# Patient Record
Sex: Male | Born: 1960
Health system: Southern US, Community
[De-identification: ages and names within clinical notes are randomized; demographics above are authoritative.]

## PROBLEM LIST (undated history)

## (undated) DIAGNOSIS — I10 Essential (primary) hypertension: Secondary | ICD-10-CM

## (undated) DIAGNOSIS — I639 Cerebral infarction, unspecified: Secondary | ICD-10-CM

## (undated) DIAGNOSIS — E7800 Pure hypercholesterolemia, unspecified: Secondary | ICD-10-CM

## (undated) DIAGNOSIS — I6522 Occlusion and stenosis of left carotid artery: Secondary | ICD-10-CM

## (undated) DIAGNOSIS — G459 Transient cerebral ischemic attack, unspecified: Secondary | ICD-10-CM

## (undated) DIAGNOSIS — J45909 Unspecified asthma, uncomplicated: Secondary | ICD-10-CM

## (undated) DIAGNOSIS — I8290 Acute embolism and thrombosis of unspecified vein: Secondary | ICD-10-CM

## (undated) HISTORY — DX: Transient cerebral ischemic attack, unspecified: G45.9

## (undated) HISTORY — DX: Occlusion and stenosis of left carotid artery: I65.22

## (undated) HISTORY — DX: Cerebral infarction, unspecified: I63.9

## (undated) HISTORY — DX: Essential (primary) hypertension: I10

## (undated) HISTORY — DX: Pure hypercholesterolemia, unspecified: E78.00

## (undated) HISTORY — DX: Acute embolism and thrombosis of unspecified vein: I82.90

## (undated) HISTORY — DX: Unspecified asthma, uncomplicated: J45.909

---

## 2018-04-04 ENCOUNTER — Ambulatory Visit: Payer: Self-pay | Admitting: Adult Health

## 2018-04-04 ENCOUNTER — Encounter: Payer: Self-pay | Admitting: Adult Health

## 2018-04-04 VITALS — BP 150/66 | HR 67 | Temp 99.9°F | Ht 67.0 in | Wt 164.9 lb

## 2018-04-04 DIAGNOSIS — J449 Chronic obstructive pulmonary disease, unspecified: Secondary | ICD-10-CM

## 2018-04-04 DIAGNOSIS — J45909 Unspecified asthma, uncomplicated: Secondary | ICD-10-CM | POA: Insufficient documentation

## 2018-04-04 DIAGNOSIS — F17200 Nicotine dependence, unspecified, uncomplicated: Secondary | ICD-10-CM

## 2018-04-04 DIAGNOSIS — J4530 Mild persistent asthma, uncomplicated: Secondary | ICD-10-CM

## 2018-04-04 DIAGNOSIS — I10 Essential (primary) hypertension: Secondary | ICD-10-CM

## 2018-04-04 DIAGNOSIS — Z0000 Encounter for general adult medical examination without abnormal findings: Secondary | ICD-10-CM

## 2018-04-04 DIAGNOSIS — E782 Mixed hyperlipidemia: Secondary | ICD-10-CM

## 2018-04-04 DIAGNOSIS — Z8673 Personal history of transient ischemic attack (TIA), and cerebral infarction without residual deficits: Secondary | ICD-10-CM

## 2018-04-04 MED ORDER — ALBUTEROL SULFATE HFA 108 (90 BASE) MCG/ACT IN AERS: 1.0000 | INHALATION_SPRAY | RESPIRATORY_TRACT | 2 refills | Status: AC | PRN

## 2018-04-04 MED ORDER — HYDROCHLOROTHIAZIDE 12.5 MG PO CAPS: 12.5000 mg | ORAL_CAPSULE | Freq: Every day | ORAL | 3 refills | Status: AC

## 2018-04-04 MED ORDER — ATORVASTATIN CALCIUM 10 MG PO TABS: 10.0000 mg | ORAL_TABLET | Freq: Every day | ORAL | 3 refills | Status: DC

## 2018-04-04 MED ORDER — ASPIRIN EC 81 MG PO TBEC: 81.0000 mg | DELAYED_RELEASE_TABLET | Freq: Every day | ORAL | 3 refills | Status: DC

## 2018-04-04 MED ORDER — TIOTROPIUM BROMIDE MONOHYDRATE 18 MCG IN CAPS: 18.0000 ug | ORAL_CAPSULE | Freq: Every day | RESPIRATORY_TRACT | 12 refills | Status: AC

## 2018-04-04 MED ORDER — LISINOPRIL 20 MG PO TABS: 20.0000 mg | ORAL_TABLET | Freq: Every day | ORAL | 2 refills | Status: AC

## 2018-04-04 MED FILL — ASPIRIN ADULT LOW STRENGTH: 81 | 30 days supply | Qty: 30 | Fill #0 | Status: TO

## 2018-04-04 MED FILL — ATORVASTATIN 10 MG TABLET: 10 | 30 days supply | Qty: 30 | Fill #0 | Status: TO

## 2018-04-04 MED FILL — LISINOPRIL 20 MG TABLET: 20 | 90 days supply | Qty: 90 | Fill #0 | Status: TO

## 2018-04-04 MED FILL — HYDROCHLOROTHIAZIDE 12.5 MG: 12.5 | 90 days supply | Qty: 90 | Fill #0 | Status: TO

## 2018-04-04 MED FILL — SPIRIVA 18 MCG CP-HANDIHALE: 18 | 30 days supply | Qty: 30 | Fill #0 | Status: TO

## 2018-04-04 MED FILL — ALBUTEROL SULFATE HFA 108 (: 108 (90 BAS | 34 days supply | Qty: 18 | Fill #0 | Status: TO

## 2018-04-04 NOTE — Progress Notes (Signed)
Patient ID: Tyler Mathews, male   DOB: 1961-10-05, 57 y.o.   MRN: [id number] CC: MY BLOOD PRESSURE IS HIGH  HPI Tyler Mathews is a 57 y.o. male who presents for an initial office visit, physical exam and follow-up for hypertension. Reports blood pressure readings in the 190s for the past couple of days. He is not currently taking any BP medications. He was on lisinopril and HCTZ but stopped taking them about 7 months ago due to  Financial reasons. He has a h/o CVA and reports multiple TIAs. He was on a statin and ASA but equally stopped taking them. He denies headache, dizziness, chest pain, palpitations, nausea and vomiting but reports residual speech impairment from previous CVA and difficulty ambulating. He currently living in a homeless shelter. He is a current every day smoker and smokes about half a pack a day. He has asthma and  uses an albuterol inhaler and reports using it about 4-5 times a day. He denies cough, wheezing but reports occasional dyspnea and DOE.    Past Medical History:  Diagnosis Date  . Asthma   . Carotid artery obstruction, left   . Clot    behind right eye  . High cholesterol   . Hypertension   . Stroke ([place])   . Tyler (transient ischemic attack)     History reviewed. No pertinent surgical history.  Family History  Problem Relation Age of Onset  . Heart disease Mother   . Stroke Father   . Stroke Brother     Social History Social History   Tobacco Use  . Smoking status: Current Every Day Smoker    Packs/day: 0.25  . Smokeless tobacco: Never Used  Substance Use Topics  . Alcohol use: Not Currently  . Drug use: Not Currently    No Known Allergies  Current Outpatient Medications  Medication Sig Dispense Refill  . albuterol (PROVENTIL HFA;VENTOLIN HFA) 108 (90 Base) MCG/ACT inhaler Inhale 1 puff into the lungs every 4 (four) hours as needed for wheezing or shortness of breath. 1 Inhaler 2  . hydrochlorothiazide (MICROZIDE) 12.5 MG capsule Take 1  capsule (12.5 mg total) by mouth daily. 90 capsule 3  . lisinopril (PRINIVIL,ZESTRIL) 20 MG tablet Take 1 tablet (20 mg total) by mouth at bedtime. 90 tablet 2  . aspirin EC 81 MG tablet Take 1 tablet (81 mg total) by mouth daily. 30 tablet 3  . atorvastatin (LIPITOR) 10 MG tablet Take 1 tablet (10 mg total) by mouth daily at 6 PM. 30 tablet 3  . tiotropium (SPIRIVA) 18 MCG inhalation capsule Place 1 capsule (18 mcg total) into inhaler and inhale daily. 30 capsule 12   No current facility-administered medications for this visit.     Review of Systems Review of Systems  Constitutional: Negative.   HENT: Negative.   Eyes: Positive for visual disturbance (uses glasses and reports blurred vision).  Respiratory: Positive for shortness of breath. Negative for apnea and wheezing.   Cardiovascular: Negative.   Gastrointestinal: Negative.   Endocrine: Negative.   Genitourinary: Negative.   Musculoskeletal: Negative.   Skin: Negative.   Allergic/Immunologic: Negative.   Neurological: Positive for speech difficulty and weakness (mild leg weakness). Negative for dizziness and headaches.  Hematological: Negative.   Psychiatric/Behavioral: Negative.     Blood pressure (!) 150/66, pulse 67, temperature 99.9 F (37.7 C), height _0  (1.702 m), weight 164 lb 14.4 oz (74.8 kg).  Physical Exam Physical Exam  Constitutional: He is oriented to person, place, and time.  He appears well-developed and well-nourished.  HENT:  Head: Normocephalic and atraumatic.  Nose: Nose normal.  Mouth/Throat: Oropharynx is clear and moist.  Eyes: Pupils are equal, round, and reactive to light. Conjunctivae and EOM are normal.  Neck: Normal range of motion. Neck supple.  Cardiovascular: Normal rate, regular rhythm, normal heart sounds and intact distal pulses.  Pulmonary/Chest: Effort normal and breath sounds normal.  Abdominal: Soft. Bowel sounds are normal.  Genitourinary:  Genitourinary Comments: deferred   Musculoskeletal: Normal range of motion.  Neurological: He is alert and oriented to person, place, and time.  Skin: Skin is warm and dry.  Psychiatric: He has a normal mood and affect.    Data Reviewed Baseline labs ordered  Assessment and plan 1. Health maintenance examination Residual defects from CVA.  - CBC w/Diff - Comp Met (CMET) - Magnesium - Phosphorus - Lipid Profile - HgB A1c - PSA - TSH - HepB+HepC+HIV Panel - Urine Microalbumin w/creat. ratio - Urinalysis, Routine w reflex microscopic  2. Hypertension, uncontrolled Start HCTZ 12.5 MG Q AM and lisinopril 24m QHS.  Monitor and log blood pressure readings  3. Mixed hyperlipidemia Will check lipid panel and start Lipitor 14mQHS  4. History of CVA (cerebrovascular accident) BP and lipid control and ASA daily  5. Mild persistent asthma without complication Continue albuterol inhaler and start  Spiriva 18 mcg inhaled daily  6. Chronic obstructive pulmonary disease, unspecified COPD type (HCS.N.P.J.No baseline PFTs. Continue current inhalers  7. Tobacco use disorder Smoking cessation advice given. Patient is not willing to quit  Tyler Mathews 04/04/2018, 4:29 PM

## 2018-04-06 LAB — URINALYSIS, ROUTINE W REFLEX MICROSCOPIC
BILIRUBIN UA: NEGATIVE
Glucose, UA: NEGATIVE
KETONES UA: NEGATIVE
Leukocytes, UA: NEGATIVE
NITRITE UA: NEGATIVE
PH UA: 5.5 (ref 5.0–7.5)
RBC, UA: NEGATIVE
Specific Gravity, UA: 1.016 (ref 1.005–1.030)
UUROB: 0.2 mg/dL (ref 0.2–1.0)

## 2018-04-06 LAB — COMPREHENSIVE METABOLIC PANEL
ALBUMIN: 4.4 g/dL (ref 3.5–5.5)
ALK PHOS: 73 IU/L (ref 39–117)
ALT: 11 IU/L (ref 0–44)
AST: 16 IU/L (ref 0–40)
Albumin/Globulin Ratio: 1.9 (ref 1.2–2.2)
BUN / CREAT RATIO: 10 (ref 9–20)
BUN: 14 mg/dL (ref 6–24)
Bilirubin Total: 0.3 mg/dL (ref 0.0–1.2)
CO2: 26 mmol/L (ref 20–29)
CREATININE: 1.46 mg/dL — AB (ref 0.76–1.27)
Calcium: 9 mg/dL (ref 8.7–10.2)
Chloride: 101 mmol/L (ref 96–106)
GFR calc Af Amer: 62 mL/min/{1.73_m2} (ref >59)
GFR calc non Af Amer: 53 mL/min/{1.73_m2} — ABNORMAL LOW (ref >59)
GLUCOSE: 105 mg/dL — AB (ref 65–99)
Globulin, Total: 2.3 g/dL (ref 1.5–4.5)
Potassium: 4.6 mmol/L (ref 3.5–5.2)
Sodium: 142 mmol/L (ref 134–144)
Total Protein: 6.7 g/dL (ref 6.0–8.5)

## 2018-04-06 LAB — HEPB+HEPC+HIV PANEL
HEP B C IGM: NEGATIVE
HEP B E AB: NEGATIVE
HEP B E AG: NEGATIVE
HIV Screen 4th Generation wRfx: NONREACTIVE
Hep B Core Total Ab: NEGATIVE
Hep B Surface Ab, Qual: NONREACTIVE
Hepatitis B Surface Ag: NEGATIVE

## 2018-04-06 LAB — LIPID PANEL
CHOLESTEROL TOTAL: 165 mg/dL (ref 100–199)
Chol/HDL Ratio: 4.9 ratio (ref 0.0–5.0)
HDL: 34 mg/dL — AB (ref >39)
LDL Calculated: 110 mg/dL — ABNORMAL HIGH (ref 0–99)
TRIGLYCERIDES: 105 mg/dL (ref 0–149)
VLDL Cholesterol Cal: 21 mg/dL (ref 5–40)

## 2018-04-06 LAB — CBC WITH DIFFERENTIAL/PLATELET
BASOS ABS: 0 10*3/uL (ref 0.0–0.2)
Basos: 1 %
EOS (ABSOLUTE): 0.2 10*3/uL (ref 0.0–0.4)
Eos: 3 %
HEMOGLOBIN: 14.2 g/dL (ref 13.0–17.7)
Hematocrit: 41.8 % (ref 37.5–51.0)
IMMATURE GRANS (ABS): 0 10*3/uL (ref 0.0–0.1)
Immature Granulocytes: 0 %
LYMPHS ABS: 2.1 10*3/uL (ref 0.7–3.1)
LYMPHS: 33 %
MCH: 33.9 pg — ABNORMAL HIGH (ref 26.6–33.0)
MCHC: 34 g/dL (ref 31.5–35.7)
MCV: 100 fL — ABNORMAL HIGH (ref 79–97)
Monocytes Absolute: 0.4 10*3/uL (ref 0.1–0.9)
Monocytes: 7 %
Neutrophils Absolute: 3.6 10*3/uL (ref 1.4–7.0)
Neutrophils: 56 %
PLATELETS: 195 10*3/uL (ref 150–379)
RBC: 4.19 x10E6/uL (ref 4.14–5.80)
RDW: 13.8 % (ref 12.3–15.4)
WBC: 6.4 10*3/uL (ref 3.4–10.8)

## 2018-04-06 LAB — MICROALBUMIN / CREATININE URINE RATIO
Creatinine, Urine: 189.9 mg/dL
MICROALBUM., U, RANDOM: 47.1 ug/mL
Microalb/Creat Ratio: 24.8 mg/g creat (ref 0.0–30.0)

## 2018-04-06 LAB — TSH: TSH: 3.4 u[IU]/mL (ref 0.450–4.500)

## 2018-04-06 LAB — HEMOGLOBIN A1C
Est. average glucose Bld gHb Est-mCnc: 111 mg/dL
Hgb A1c MFr Bld: 5.5 % (ref 4.8–5.6)

## 2018-04-06 LAB — MAGNESIUM: MAGNESIUM: 2.1 mg/dL (ref 1.6–2.3)

## 2018-04-06 LAB — PSA: PROSTATE SPECIFIC AG, SERUM: 1.8 ng/mL (ref 0.0–4.0)

## 2018-04-06 LAB — PHOSPHORUS: PHOSPHORUS: 3 mg/dL (ref 2.5–4.5)

## 2018-04-08 ENCOUNTER — Ambulatory Visit: Payer: Self-pay | Admitting: Pharmacy Technician

## 2018-04-08 DIAGNOSIS — Z79899 Other long term (current) drug therapy: Secondary | ICD-10-CM

## 2018-04-08 NOTE — Progress Notes (Signed)
Patient inquired about applying for financial assistance  for [facility].  Patient did not want to complete application during eligibility appointment.  Provided patient with financial application for charity care.  Patient agreed to complete application and gather financial information and forward to appropriate department in [facility].    Completed [facility] application and contract.  Patient agreed to all terms of the [facility] contract.    Patient approved to receive medication assistance at [facility] through 2019, as long as eligibility criteria continues to be met.    Provided patient with Civil engineer, contracting based on his particular needs.    Referred patient for MTM.  Proventil Prescription Application and Spiriva Handihaler Prescription Application completed with patient.  Forwarded to [facility] for signature.  Upon receipt of signed applications from [facility], Proventil Prescription Application and Spiriva Handihaler Prescription Application will be submitted to manufacturers.  [facility]

## 2018-04-10 ENCOUNTER — Encounter: Payer: Self-pay | Admitting: Internal Medicine

## 2018-04-10 ENCOUNTER — Ambulatory Visit: Payer: Medicaid Other | Admitting: Internal Medicine

## 2018-04-10 VITALS — BP 125/75 | HR 54 | Temp 98.7°F | Wt 163.6 lb

## 2018-04-10 DIAGNOSIS — I10 Essential (primary) hypertension: Secondary | ICD-10-CM

## 2018-04-10 DIAGNOSIS — Z8673 Personal history of transient ischemic attack (TIA), and cerebral infarction without residual deficits: Secondary | ICD-10-CM

## 2018-04-10 DIAGNOSIS — J4530 Mild persistent asthma, uncomplicated: Secondary | ICD-10-CM

## 2018-04-10 NOTE — Progress Notes (Signed)
   Subjective:    Patient ID: Tyler Mathews, male    DOB: 1961-11-09, 57 y.o.   MRN: [id number]  HPI   Pt is here for a f/u on labs. He has no new complaints.  Pt has a history of strokes and a left carotid artery obstruction.  Allergies as of 04/10/2018   No Known Allergies     Medication List        Accurate as of 04/10/18  9:43 AM. Always use your most recent med list.          albuterol 108 (90 Base) MCG/ACT inhaler Commonly known as:  PROVENTIL HFA;VENTOLIN HFA Inhale 1 puff into the lungs every 4 (four) hours as needed for wheezing or shortness of breath.   aspirin EC 81 MG tablet Take 1 tablet (81 mg total) by mouth daily.   atorvastatin 10 MG tablet Commonly known as:  LIPITOR Take 1 tablet (10 mg total) by mouth daily at 6 PM.   hydrochlorothiazide 12.5 MG capsule Commonly known as:  MICROZIDE Take 1 capsule (12.5 mg total) by mouth daily.   lisinopril 20 MG tablet Commonly known as:  PRINIVIL,ZESTRIL Take 1 tablet (20 mg total) by mouth at bedtime.   tiotropium 18 MCG inhalation capsule Commonly known as:  SPIRIVA Place 1 capsule (18 mcg total) into inhaler and inhale daily.      Patient Active Problem List   Diagnosis Date Noted  . Asthma 04/04/2018     Review of Systems     Objective:   Physical Exam  Constitutional: He is oriented to person, place, and time.  Cardiovascular: Normal rate, regular rhythm and normal heart sounds.  Pulmonary/Chest: Effort normal and breath sounds normal.  Neurological: He is alert and oriented to person, place, and time.    BP 125/75   Pulse (!) 54   Temp 98.7 F (37.1 C) (Oral)   Wt 163 lb 9.6 oz (74.2 kg)   BMI 25.62 kg/m        Assessment & Plan:   F/u in 6 months w/ labs. Advised pt to drink more water to flush out toxins.

## 2018-04-11 ENCOUNTER — Ambulatory Visit: Payer: Self-pay | Admitting: Adult Health

## 2018-04-11 ENCOUNTER — Ambulatory Visit: Payer: Self-pay

## 2018-04-29 ENCOUNTER — Telehealth: Payer: Self-pay | Admitting: Pharmacist

## 2018-04-29 NOTE — Telephone Encounter (Signed)
04/29/2018 8:53:42 AM - Ventolin HFA & Incruse Ellipta  04/29/18 Faxed [employer] application for enrollment for Ventolin HFA 7090mcg-Inhale 1 puff into the lungs every 4 hours as needed for wheezing & shortness of breath, also Incruse Ellipta 62.85mcg-Inhale 1 puff into the lungs once a day everyday. Forde RadonAJ

## 2018-04-30 ENCOUNTER — Telehealth: Payer: Self-pay | Admitting: Pharmacist

## 2018-05-21 ENCOUNTER — Ambulatory Visit: Payer: Medicaid Other | Admitting: Family Medicine

## 2018-05-21 VITALS — BP 166/87 | HR 87 | Temp 99.3°F | Ht 69.0 in | Wt 167.1 lb

## 2018-05-21 DIAGNOSIS — F17200 Nicotine dependence, unspecified, uncomplicated: Secondary | ICD-10-CM

## 2018-05-21 DIAGNOSIS — R7309 Other abnormal glucose: Secondary | ICD-10-CM

## 2018-05-21 DIAGNOSIS — R42 Dizziness and giddiness: Secondary | ICD-10-CM

## 2018-05-21 DIAGNOSIS — I10 Essential (primary) hypertension: Secondary | ICD-10-CM

## 2018-05-21 DIAGNOSIS — J4530 Mild persistent asthma, uncomplicated: Secondary | ICD-10-CM

## 2018-05-21 DIAGNOSIS — E782 Mixed hyperlipidemia: Secondary | ICD-10-CM

## 2018-05-21 DIAGNOSIS — Z09 Encounter for follow-up examination after completed treatment for conditions other than malignant neoplasm: Secondary | ICD-10-CM

## 2018-05-21 MED ORDER — MECLIZINE HCL 25 MG PO CHEW: 25.0000 mg | CHEWABLE_TABLET | Freq: Every day | ORAL | 1 refills | Status: AC

## 2018-05-21 NOTE — Progress Notes (Signed)
Subjective:    Patient ID: Tyler Mathews, male    DOB: [birth year], 57 y.o.   MRN: [id number]   PCP: Raliegh Ip, NP  Chief Complaint  Patient presents with  . Dizziness    Worse in the AM  . Hypertension   HPI  Mr. Panebianco has a past medical history of TIAs, Stroke, Hypertension, Hyperlipidemia, and Asthma. He is here for follow up and co.   Current Status: He is doing well with no complaints of dizziness when he awake, which last for up to 5 hours. He states that when he is lying down, he feels as though his head is spinning. No reports of GI problems such as nausea, vomiting, diarrhea, and constipation. She has no reports of blood in stools, dysuria and hematuria.   He denies fevers, chills, fatigue, recent infections, weight loss, and night sweats.   He has has vision changes and needs to follow up with Optometry. He has not had any headaches and falls.   He has occasional cough and shortness of breath, which he r/t his Asthma. No chest pain, heart palpitations reported.   He has mild depression.   He continues to smoke 1/2 pack of cigarettes a day.    He has no pain today.   Past Medical History:  Diagnosis Date  . Asthma   . Carotid artery obstruction, left   . Clot    behind right eye  . High cholesterol   . Hypertension   . Stroke ([facility])   . TIA (transient ischemic attack)     Family History  Problem Relation Age of Onset  . Heart disease Mother   . Stroke Father   . Stroke Brother     Social History   Socioeconomic History  . Marital status: Single    Spouse name: Not on file  . Number of children: Not on file  . Years of education: Not on file  . Highest education level: Not on file  Occupational History  . Not on file  Social Needs  . Financial resource strain: Not on file  . Food insecurity:    Worry: Not on file    Inability: Not on file  . Transportation needs:    Medical: Not on file    Non-medical: Not on file  Tobacco Use  .  Smoking status: Current Every Day Smoker    Packs/day: 0.25    Years: 48.00    Pack years: 12.00  . Smokeless tobacco: Never Used  Substance and Sexual Activity  . Alcohol use: Not Currently  . Drug use: Not Currently  . Sexual activity: Not on file  Lifestyle  . Physical activity:    Days per week: Not on file    Minutes per session: Not on file  . Stress: Not on file  Relationships  . Social connections:    Talks on phone: Not on file    Gets together: Not on file    Attends religious service: Not on file    Active member of club or organization: Not on file    Attends meetings of clubs or organizations: Not on file    Relationship status: Not on file  . Intimate partner violence:    Fear of current or ex partner: Not on file    Emotionally abused: Not on file    Physically abused: Not on file    Forced sexual activity: Not on file  Other Topics Concern  . Not on file  Social  History Narrative  . Not on file    No past surgical history on file.   There is no immunization history on file for this patient.  Current Meds  Medication Sig  . albuterol (PROVENTIL HFA;VENTOLIN HFA) 108 (90 Base) MCG/ACT inhaler Inhale 1 puff into the lungs every 4 (four) hours as needed for wheezing or shortness of breath.  Marland Kitchen aspirin EC 81 MG tablet Take 1 tablet (81 mg total) by mouth daily.  Marland Kitchen atorvastatin (LIPITOR) 10 MG tablet Take 1 tablet (10 mg total) by mouth daily at 6 PM.  . hydrochlorothiazide (MICROZIDE) 12.5 MG capsule Take 1 capsule (12.5 mg total) by mouth daily.  Marland Kitchen lisinopril (PRINIVIL,ZESTRIL) 20 MG tablet Take 1 tablet (20 mg total) by mouth at bedtime.   No Known Allergies  BP (!) 166/87   Pulse 87   Temp 99.3 F (37.4 C)   Ht 5\' 9"  (1.753 m)   Wt 167 lb 1.6 oz (75.8 kg)   BMI 24.68 kg/m   Review of Systems  Constitutional: Negative.   HENT: Negative.   Eyes: Negative.   Respiratory: Positive for cough and shortness of breath.   Cardiovascular: Negative.    Gastrointestinal: Negative.   Endocrine: Negative.   Genitourinary: Negative.   Musculoskeletal: Negative.   Skin: Negative.   Allergic/Immunologic: Negative.   Neurological: Negative.   Hematological: Negative.   Psychiatric/Behavioral: Negative.    Objective:   Physical Exam  Constitutional: He is oriented to person, place, and time. He appears well-developed and well-nourished.  HENT:  Head: Normocephalic and atraumatic.  Right Ear: External ear normal.  Left Ear: External ear normal.  Nose: Nose normal.  Mouth/Throat: Oropharynx is clear and moist.  Eyes: Pupils are equal, round, and reactive to light. Conjunctivae and EOM are normal.  Neck: Normal range of motion. Neck supple.  Cardiovascular: Normal rate, regular rhythm, normal heart sounds and intact distal pulses.  Pulmonary/Chest: Effort normal and breath sounds normal.  Abdominal: Soft. Bowel sounds are normal.  Musculoskeletal: Normal range of motion.  Neurological: He is alert and oriented to person, place, and time.  Skin: Skin is warm and dry. Capillary refill takes less than 2 seconds.  Psychiatric: He has a normal mood and affect. His behavior is normal. Judgment normal.  Nursing note and vitals reviewed.  Assessment & Plan:   1. Dizziness He has not had any dizzy episodes this past week, but continues to have them intermittently. We will initiate trial of Meclizine today.  - Meclizine HCl 25 MG CHEW; Chew 1 tablet (25 mg total) by mouth daily.  Dispense: 30 each; Refill: 1  2. Smoker He continue to smoke 1/2 pack a day. He will contact office when he is ready for smoking cessation.  - CBC w/Diff  3. Hypertension, unspecified type Blood pressure is elevated at 166/87 today. He will continue HCTZ and Lisinopril as prescribed.   4. Mixed hyperlipidemia Lipid level stable on 04/04/2018.  - Comprehensive metabolic panel - Lipid Profile  5. Elevated glucose Hgb A1c normal at 5.5 on 04/04/2018.  - HgB  A1c  6. Mild persistent asthma without complication Stable. He will continue Albuterol as prescribed.   7. Follow up He will follow up in 2 weeks for effectiveness of Meclizine.   Meds ordered this encounter  Medications  . Meclizine HCl 25 MG CHEW    Sig: Chew 1 tablet (25 mg total) by mouth daily.    Dispense:  30 each    Refill:  1  Raliegh IpNatalie Aubrii Sharpless,  MSN, FNP-BC [facility] [place] [address] [place], [place] [id number] [phone]

## 2018-05-22 LAB — COMPREHENSIVE METABOLIC PANEL
ALT: 12 IU/L (ref 0–44)
AST: 17 IU/L (ref 0–40)
Albumin/Globulin Ratio: 1.8 (ref 1.2–2.2)
Albumin: 4.2 g/dL (ref 3.5–5.5)
Alkaline Phosphatase: 87 IU/L (ref 39–117)
BUN/Creatinine Ratio: 12 (ref 9–20)
BUN: 16 mg/dL (ref 6–24)
Bilirubin Total: <0.2 mg/dL (ref 0.0–1.2)
CO2: 28 mmol/L (ref 20–29)
Calcium: 9 mg/dL (ref 8.7–10.2)
Chloride: 102 mmol/L (ref 96–106)
Creatinine, Ser: 1.38 mg/dL — ABNORMAL HIGH (ref 0.76–1.27)
GFR calc Af Amer: 66 mL/min/{1.73_m2} (ref >59)
GFR calc non Af Amer: 57 mL/min/{1.73_m2} — ABNORMAL LOW (ref >59)
Globulin, Total: 2.3 g/dL (ref 1.5–4.5)
Glucose: 91 mg/dL (ref 65–99)
Potassium: 3.7 mmol/L (ref 3.5–5.2)
Sodium: 145 mmol/L — ABNORMAL HIGH (ref 134–144)
Total Protein: 6.5 g/dL (ref 6.0–8.5)

## 2018-05-22 LAB — CBC WITH DIFFERENTIAL/PLATELET
Basophils Absolute: 0 10*3/uL (ref 0.0–0.2)
Basos: 1 %
EOS (ABSOLUTE): 0.2 10*3/uL (ref 0.0–0.4)
Eos: 3 %
Hematocrit: 42 % (ref 37.5–51.0)
Hemoglobin: 14.1 g/dL (ref 13.0–17.7)
Immature Grans (Abs): 0 10*3/uL (ref 0.0–0.1)
Immature Granulocytes: 0 %
Lymphocytes Absolute: 2 10*3/uL (ref 0.7–3.1)
Lymphs: 34 %
MCH: 33.6 pg — ABNORMAL HIGH (ref 26.6–33.0)
MCHC: 33.6 g/dL (ref 31.5–35.7)
MCV: 100 fL — ABNORMAL HIGH (ref 79–97)
Monocytes Absolute: 0.5 10*3/uL (ref 0.1–0.9)
Monocytes: 8 %
Neutrophils Absolute: 3.2 10*3/uL (ref 1.4–7.0)
Neutrophils: 54 %
Platelets: 171 10*3/uL (ref 150–450)
RBC: 4.2 x10E6/uL (ref 4.14–5.80)
RDW: 13.3 % (ref 12.3–15.4)
WBC: 6 10*3/uL (ref 3.4–10.8)

## 2018-05-22 LAB — LIPID PANEL
Chol/HDL Ratio: 3.1 ratio (ref 0.0–5.0)
Cholesterol, Total: 132 mg/dL (ref 100–199)
HDL: 42 mg/dL (ref >39)
LDL Calculated: 61 mg/dL (ref 0–99)
Triglycerides: 143 mg/dL (ref 0–149)
VLDL Cholesterol Cal: 29 mg/dL (ref 5–40)

## 2018-05-22 LAB — HEMOGLOBIN A1C
Est. average glucose Bld gHb Est-mCnc: 117 mg/dL
Hgb A1c MFr Bld: 5.7 % — ABNORMAL HIGH (ref 4.8–5.6)

## 2018-06-04 ENCOUNTER — Telehealth: Payer: Self-pay | Admitting: Pharmacist

## 2018-06-04 NOTE — Telephone Encounter (Signed)
06/04/2018 8:51:30 AM - refills-Ventolin HFA & Incruse Ellipta  06/04/18 Placed refills online with [employer] for Ventolin HFA & Incruse Ellipta 62.5-to release 07/26/18, order#[id number].Forde RadonAJ

## 2018-06-12 ENCOUNTER — Telehealth: Payer: Self-pay | Admitting: Ophthalmology

## 2018-06-12 NOTE — Telephone Encounter (Signed)
Attempted to call pt to discuss appointment scheduled for DOS 06/13/2018. Left message for pt to call back within in the next 24 hours.

## 2018-06-13 ENCOUNTER — Ambulatory Visit: Payer: Medicaid Other | Admitting: Ophthalmology

## 2018-06-13 ENCOUNTER — Ambulatory Visit: Payer: Medicaid Other | Admitting: Obstetrics and Gynecology

## 2018-06-13 VITALS — BP 153/73 | HR 76 | Temp 99.1°F | Ht 67.5 in | Wt 165.5 lb

## 2018-06-13 DIAGNOSIS — K219 Gastro-esophageal reflux disease without esophagitis: Secondary | ICD-10-CM | POA: Insufficient documentation

## 2018-06-13 DIAGNOSIS — N289 Disorder of kidney and ureter, unspecified: Secondary | ICD-10-CM | POA: Insufficient documentation

## 2018-06-13 MED ORDER — OMEPRAZOLE 20 MG PO CPDR: 20.0000 mg | DELAYED_RELEASE_CAPSULE | Freq: Every day | ORAL | 2 refills | Status: DC

## 2018-06-13 NOTE — Patient Instructions (Signed)
I value your feedback and entrusting us with your care. If you get a [facility] patient survey, I would appreciate you taking the time to let us know about your experience today. Thank you! 

## 2018-06-13 NOTE — Progress Notes (Signed)
No primary care provider on file.   Chief Complaint  Patient presents with  . Follow-up    heart burn x 2-3 wks    HPI:      [name] is a 57 y.o. No obstetric history on file. who LMP was No LMP for male patient., presents today for heartburn sx the past 2-3 wks. Hx of GERD in past and took protonix daily. Off it for the past 1 1/2 yrs but sx have returned. No change in diet recently. No hx of ulcers/esophagitis. No recent CP/SOB. No n/v/d/constipation, no blood in stool.   Pt with HTN, abn kidney function tests 5/19 and 6/19, but improved 6/19. Rechk in 1 mo. Taking BP meds.   CMP Latest Ref Rng & Units 05/21/2018 04/04/2018  Glucose 65 - 99 mg/dL 91 161(W)  BUN 6 - 24 mg/dL 16 14  Creatinine 9.60 - 1.27 mg/dL 4.54(U) 9.81(X)  Sodium 134 - 144 mmol/L 145(H) 142  Potassium 3.5 - 5.2 mmol/L 3.7 4.6  Chloride 96 - 106 mmol/L 102 101  CO2 20 - 29 mmol/L 28 26  Calcium 8.7 - 10.2 mg/dL 9.0 9.0  Total Protein 6.0 - 8.5 g/dL 6.5 6.7  Total Bilirubin 0.0 - 1.2 mg/dL <9.1 0.3  Alkaline Phos 39 - 117 IU/L 87 73  AST 0 - 40 IU/L 17 16  ALT 0 - 44 IU/L 12 11     Past Medical History:  Diagnosis Date  . Asthma   . Carotid artery obstruction, left   . Clot    behind right eye  . High cholesterol   . Hypertension   . Stroke ([facility])   . TIA (transient ischemic attack)     History reviewed. No pertinent surgical history.  Family History  Problem Relation Age of Onset  . Heart disease Mother   . Stroke Father   . Stroke Brother     Social History   Socioeconomic History  . Marital status: Single    Spouse name: Not on file  . Number of children: Not on file  . Years of education: Not on file  . Highest education level: Not on file  Occupational History  . Occupation: In a Research scientist (physical sciences) for work  Social Needs  . Financial resource strain: Very hard  . Food insecurity:    Worry: Often true    Inability: Often true  . Transportation needs:   Medical: No    Non-medical: No  Tobacco Use  . Smoking status: Current Every Day Smoker    Packs/day: 0.25    Years: 48.00    Pack years: 12.00  . Smokeless tobacco: Never Used  Substance and Sexual Activity  . Alcohol use: Not Currently  . Drug use: Not Currently  . Sexual activity: Not on file  Lifestyle  . Physical activity:    Days per week: 0 days    Minutes per session: 0 min  . Stress: To some extent  Relationships  . Social connections:    Talks on phone: Never    Gets together: Never    Attends religious service: Never    Active member of club or organization: No    Attends meetings of clubs or organizations: Never    Relationship status: Divorced  . Intimate partner violence:    Fear of current or ex partner: Not on file    Emotionally abused: Not on file    Physically abused: Not on file  Forced sexual activity: Not on file  Other Topics Concern  . Not on file  Social History Narrative   Patient is single.    Outpatient Medications Prior to Visit  Medication Sig Dispense Refill  . albuterol (PROVENTIL HFA;VENTOLIN HFA) 108 (90 Base) MCG/ACT inhaler Inhale 1 puff into the lungs every 4 (four) hours as needed for wheezing or shortness of breath. 1 Inhaler 2  . aspirin EC 81 MG tablet Take 1 tablet (81 mg total) by mouth daily. 30 tablet 3  . atorvastatin (LIPITOR) 10 MG tablet Take 1 tablet (10 mg total) by mouth daily at 6 PM. 30 tablet 3  . hydrochlorothiazide (MICROZIDE) 12.5 MG capsule Take 1 capsule (12.5 mg total) by mouth daily. 90 capsule 3  . lisinopril (PRINIVIL,ZESTRIL) 20 MG tablet Take 1 tablet (20 mg total) by mouth at bedtime. 90 tablet 2  . Meclizine HCl 25 MG CHEW Chew 1 tablet (25 mg total) by mouth daily. 30 each 1  . tiotropium (SPIRIVA) 18 MCG inhalation capsule Place 1 capsule (18 mcg total) into inhaler and inhale daily. (Patient not taking: Reported on 05/21/2018) 30 capsule 12   No facility-administered medications prior to visit.        ROS:  Review of Systems  Constitutional: Negative.   Respiratory: Negative.  Negative for cough and shortness of breath.   Cardiovascular: Negative.  Negative for chest pain.  Gastrointestinal: Negative for abdominal pain, blood in stool, constipation and nausea.  Hematological: Negative.   Psychiatric/Behavioral: Negative.     OBJECTIVE:   Vitals:  BP (!) 153/73 (BP Location: Left Arm)   Pulse 76   Temp 99.1 F (37.3 C)   Ht 5' 7.5" (1.715 m)   Wt 165 lb 8 oz (75.1 kg)   BMI 25.54 kg/m   Physical Exam  Constitutional: He appears well-developed.  Pulmonary/Chest: Effort normal.  Musculoskeletal: Normal range of motion.  Neurological: He is alert. No cranial nerve deficit.  Psychiatric: He has a normal mood and affect. His behavior is normal. Thought content normal.     Assessment/Plan: Gastroesophageal reflux disease without esophagitis - Rx omeprazole. F/u prn.  - Plan: omeprazole (PRILOSEC) 20 MG capsule  Abnormal kidney function - Rechk labs in 1 mo.  - Plan: Comprehensive metabolic panel    Meds ordered this encounter  Medications  . omeprazole (PRILOSEC) 20 MG capsule    Sig: Take 1 capsule (20 mg total) by mouth daily.    Dispense:  30 capsule    Refill:  2    Order Specific Question:   Supervising Provider    Answer:   Nadara MustardHARRIS, ROBERT P [[id number]]      Return in about 1 month (around 07/14/2018), or if symptoms worsen or fail to improve, for labs.  Anuar Walgren B. Colson Barco, PA-C 06/13/2018 7:40 PM

## 2018-07-31 ENCOUNTER — Other Ambulatory Visit: Payer: Self-pay | Admitting: Internal Medicine

## 2018-09-24 ENCOUNTER — Other Ambulatory Visit: Payer: Self-pay

## 2018-09-24 DIAGNOSIS — K219 Gastro-esophageal reflux disease without esophagitis: Secondary | ICD-10-CM

## 2018-09-24 MED ORDER — OMEPRAZOLE 20 MG PO CPDR: 20.0000 mg | DELAYED_RELEASE_CAPSULE | Freq: Every day | ORAL | 2 refills | Status: AC

## 2018-10-09 ENCOUNTER — Other Ambulatory Visit: Payer: Medicaid Other

## 2018-10-16 ENCOUNTER — Ambulatory Visit: Payer: Medicaid Other | Admitting: Internal Medicine

## 2018-11-09 ENCOUNTER — Emergency Department: Payer: Medicaid Other

## 2018-11-09 ENCOUNTER — Observation Stay
Admission: EM | Admit: 2018-11-09 | Discharge: 2018-11-10 | Discharge status: Against Medical Advice | Payer: Medicaid Other | Attending: Internal Medicine | Admitting: Internal Medicine

## 2018-11-09 ENCOUNTER — Other Ambulatory Visit: Payer: Self-pay

## 2018-11-09 DIAGNOSIS — Z79899 Other long term (current) drug therapy: Secondary | ICD-10-CM | POA: Diagnosis not present

## 2018-11-09 DIAGNOSIS — I639 Cerebral infarction, unspecified: Secondary | ICD-10-CM | POA: Diagnosis present

## 2018-11-09 DIAGNOSIS — Z8673 Personal history of transient ischemic attack (TIA), and cerebral infarction without residual deficits: Secondary | ICD-10-CM | POA: Insufficient documentation

## 2018-11-09 DIAGNOSIS — G459 Transient cerebral ischemic attack, unspecified: Principal | ICD-10-CM | POA: Insufficient documentation

## 2018-11-09 DIAGNOSIS — F17210 Nicotine dependence, cigarettes, uncomplicated: Secondary | ICD-10-CM | POA: Insufficient documentation

## 2018-11-09 DIAGNOSIS — I10 Essential (primary) hypertension: Secondary | ICD-10-CM | POA: Insufficient documentation

## 2018-11-09 DIAGNOSIS — Z7982 Long term (current) use of aspirin: Secondary | ICD-10-CM | POA: Diagnosis not present

## 2018-11-09 DIAGNOSIS — R531 Weakness: Secondary | ICD-10-CM | POA: Diagnosis present

## 2018-11-09 LAB — GLUCOSE, CAPILLARY: Glucose-Capillary: 104 mg/dL — ABNORMAL HIGH (ref 70–99)

## 2018-11-09 MED ORDER — ASPIRIN 81 MG PO CHEW
324.0000 mg | CHEWABLE_TABLET | Freq: Once | ORAL | Status: AC
  Administered 2018-11-10: 324 mg via ORAL
  Filled 2018-11-09: qty 4

## 2018-11-09 NOTE — ED Triage Notes (Signed)
Patient reports symptoms that started approximately 6 hours prior to arrival.  Patient with reports right sided weakness.  Patient taken to CT prior to triage.

## 2018-11-09 NOTE — ED Provider Notes (Signed)
[facility] Emergency Department Provider Note  ____________________________________________   First MD Initiated Contact with Patient 11/09/18 2347     (approximate)  I have reviewed the triage vital signs and the nursing notes.   HISTORY  Chief Complaint Code Stroke    HPI Tyler Mathews is a 57 y.o. male who comes to the emergency department with roughly 6 hours of  right arm weakness.  He says he has had "17 strokes" in the past.  All of his healthcare has been in misery and he does not yet have a neurologist here in [place].  He takes aspirin daily for his previous strokes and no Plavix, Brilinta, or anticoagulants.  He was in his usual state of health when he woke up with worsening weakness in his right arm.  He subsequently went to a bar to participate in a drag show and following the show he thought that he should get checked out.  He denies trauma.  His symptoms were sudden onset.  Nothing seems to make them better or worse.  He denies double vision or blurred vision.  He has some nausea but no vomiting.  Denies chest pain or shortness of breath.   Past Medical History:  Diagnosis Date  . Asthma   . Carotid artery obstruction, left   . Clot    behind right eye  . High cholesterol   . Hypertension   . Stroke (HCC)   . TIA (transient ischemic attack)     Patient Active Problem List   Diagnosis Date Noted  . TIA (transient ischemic attack) 11/10/2018  . Gastroesophageal reflux disease without esophagitis 06/13/2018  . Abnormal kidney function 06/13/2018  . Asthma 04/04/2018    No past surgical history on file.  Prior to Admission medications   Medication Sig Start Date End Date Taking? Authorizing Provider  albuterol (PROVENTIL HFA;VENTOLIN HFA) 108 (90 Base) MCG/ACT inhaler Inhale 1 puff into the lungs every 4 (four) hours as needed for wheezing or shortness of breath. 04/04/18  Yes Tukov-Yual, Alroy Bailiff, NP  aspirin EC 81 MG  tablet TAKE ONE TABLET BY MOUTH EVERY DAY 07/31/18  Yes Chaplin, Jimmie Molly, MD  atorvastatin (LIPITOR) 10 MG tablet TAKE ONE TABLET BY MOUTH EVERY DAY AT 6:00pm Patient taking differently: Take 10 mg by mouth daily at 6 PM.  07/31/18  Yes Chaplin, Jimmie Molly, MD  hydrochlorothiazide (MICROZIDE) 12.5 MG capsule Take 1 capsule (12.5 mg total) by mouth daily. 04/04/18  Yes Tukov-Yual, Magdalene S, NP  lisinopril (PRINIVIL,ZESTRIL) 20 MG tablet Take 1 tablet (20 mg total) by mouth at bedtime. 04/04/18  Yes Tukov-Yual, Alroy Bailiff, NP  omeprazole (PRILOSEC) 20 MG capsule Take 1 capsule (20 mg total) by mouth daily. Patient taking differently: Take 20 mg by mouth daily as needed (reflux).  09/24/18  Yes Doles-Johnson, Teah, NP  Meclizine HCl 25 MG CHEW Chew 1 tablet (25 mg total) by mouth daily. Patient taking differently: Chew 25 mg by mouth daily as needed (dizziness).  05/21/18   Kallie Locks, FNP  tiotropium (SPIRIVA) 18 MCG inhalation capsule Place 1 capsule (18 mcg total) into inhaler and inhale daily. Patient not taking: Reported on 05/21/2018 04/04/18   Andreas Ohm, NP    Allergies Codeine  Family History  Problem Relation Age of Onset  . Heart disease Mother   . Stroke Father   . Stroke Brother     Social History Social History   Tobacco Use  . Smoking status: Current  Every Day Smoker    Packs/day: 0.25    Years: 48.00    Pack years: 12.00  . Smokeless tobacco: Never Used  Substance Use Topics  . Alcohol use: Not Currently  . Drug use: Not Currently    Review of Systems Constitutional: No fever/chills Eyes: No visual changes. ENT: No sore throat. Cardiovascular: Denies chest pain. Respiratory: Denies shortness of breath. Gastrointestinal: No abdominal pain.  No nausea, no vomiting.  No diarrhea.  No constipation. Genitourinary: Negative for dysuria. Musculoskeletal: Negative for back pain. Skin: Negative for rash. Neurological: Positive for focal  weakness   ____________________________________________   PHYSICAL EXAM:  VITAL SIGNS: ED Triage Vitals  Enc Vitals Group     BP      Pulse      Resp      Temp      Temp src      SpO2      Weight      Height      Head Circumference      Peak Flow      Pain Score      Pain Loc      Pain Edu?      Excl. in GC?     Constitutional: Alert and oriented x4 pleasant cooperative speaks in full clear sentences Eyes: PERRL EOMI. Head: Atraumatic. Nose: No congestion/rhinnorhea. Mouth/Throat: No trismus Neck: No stridor.   Cardiovascular: Normal rate, regular rhythm. Grossly normal heart sounds.  Good peripheral circulation. Respiratory: Normal respiratory effort.  No retractions. 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  Normal strength bilateral lower extremities. Skin:  Skin is warm, dry and intact. No rash noted. Psychiatric: Mood and affect are normal. Speech and behavior are normal.    ____________________________________________   DIFFERENTIAL includes but not limited to  Stroke, TIA, large vessel occlusion, migraine headache, intracerebral hemorrhage, hypoglycemia ____________________________________________   LABS (all labs ordered are listed, but only abnormal results are displayed)  Labs Reviewed  GLUCOSE, CAPILLARY - Abnormal; Notable for the following components:      Result Value   Glucose-Capillary 104 (*)    All other components within normal limits  BASIC METABOLIC PANEL - Abnormal; Notable for the following components:   Creatinine, Ser 1.31 (*)    Calcium 8.7 (*)    All other components within normal limits  URINALYSIS, ROUTINE W REFLEX MICROSCOPIC  - Abnormal; Notable for the following components:   Color, Urine STRAW (*)    APPearance CLEAR (*)    Hgb urine dipstick SMALL (*)    Protein, ur 30 (*)    All other components within normal limits  ETHANOL  URINE DRUG SCREEN, QUALITATIVE ([facility] ONLY)  PROTIME-INR    Lab work reviewed by me with no clear etiology of the patient's symptoms identified __________________________________________  EKG   ____________________________________________  RADIOLOGY  CT scan of the head reviewed by me with no intracerebral hemorrhage.  CT angiogram of the head neck reviewed by me with no acute large vessel occlusion but with severe atherosclerotic disease ____________________________________________   PROCEDURES  Procedure(s) performed: no  .Critical Care Performed by: Merrily Brittle, MD Authorized by: Merrily Brittle, MD   Critical care provider statement:    Critical care  time (minutes):  30   Critical care time was exclusive of:  Separately billable procedures and treating other patients   Critical care was necessary to treat or prevent imminent or life-threatening deterioration of the following conditions:  CNS failure or compromise   Critical care was time spent personally by me on the following activities:  Development of treatment plan with patient or surrogate, discussions with consultants, evaluation of patient's response to treatment, examination of patient, obtaining history from patient or surrogate, ordering and performing treatments and interventions, ordering and review of laboratory studies, ordering and review of radiographic studies, pulse oximetry, re-evaluation of patient's condition and review of old charts    Critical Care performed: Yes  ____________________________________________   INITIAL IMPRESSION / ASSESSMENT AND PLAN / ED COURSE  Pertinent labs & imaging results that were available during my care of the patient were reviewed by me and considered in my  medical decision making (see chart for details).   As part of my medical decision making, I reviewed the following data within the electronic MEDICAL RECORD NUMBER History obtained from family if available, nursing notes, old chart and ekg, as well as notes from prior ED visits.  Patient comes to the emergency department with around 6 hours of new onset neuro symptoms.  The patient says that he has had 17 previous strokes and he thinks he has had another.  All of [place] records according to the patient [place] in [place] he does not have a neurologist [place] in [place].  He has right-sided pronator drift along with cranial nerve XII palsy on the right and left upper facial palsy.  He is outside of any sort of window for TPA but could be a candidate for retrieval for large vessel occlusion so I am sending him back for a CT angiogram of his head and neck in addition to a perfusion.  Code stroke is been activated.  I will give him 324 mg of aspirin if he passes the swallow study and if not aspirin will go rectally.  Interestingly the patient is only on aspirin and not dual antiplatelet nor any blood thinning medication.     ----------------------------------------- I was called by the neuroradiologist that the patient has severe atherosclerotic disease however the perfusion study does not show any areas that would benefit from emergent reperfusion.  On further questioning the patient symptoms are actually more than 24 hours out so he would not be a candidate anyway.  I spoke with the telemetry neurologist who agrees with aspirin and inpatient admission.  I appreciate his elevated blood pressure however we will maintain permissive hypertension in the setting of an acute ischemic stroke.  I then discussed with the hospitalist who has graciously agreed to admit the patient to his service. ____________________________________________   FINAL CLINICAL IMPRESSION(S) / ED DIAGNOSES  Final diagnoses:  Cerebrovascular  accident (CVA), unspecified mechanism (HCC)  Hypertension, unspecified type      NEW MEDICATIONS STARTED DURING THIS VISIT:  New Prescriptions   No medications on file     Note:  This document was prepared using Dragon voice recognition software and may include unintentional dictation errors.     Merrily Brittleifenbark, Vidit Boissonneault, MD 11/10/18 [phone]

## 2018-11-10 ENCOUNTER — Emergency Department: Payer: Medicaid Other

## 2018-11-10 ENCOUNTER — Observation Stay: Admit: 2018-11-10 | Payer: Medicaid Other

## 2018-11-10 ENCOUNTER — Encounter: Payer: Self-pay | Admitting: Radiology

## 2018-11-10 ENCOUNTER — Other Ambulatory Visit: Payer: Self-pay

## 2018-11-10 DIAGNOSIS — G459 Transient cerebral ischemic attack, unspecified: Secondary | ICD-10-CM | POA: Diagnosis present

## 2018-11-10 DIAGNOSIS — I639 Cerebral infarction, unspecified: Secondary | ICD-10-CM | POA: Diagnosis not present

## 2018-11-10 LAB — URINALYSIS, ROUTINE W REFLEX MICROSCOPIC
Bacteria, UA: NONE SEEN
Bilirubin Urine: NEGATIVE
GLUCOSE, UA: NEGATIVE mg/dL
Ketones, ur: NEGATIVE mg/dL
Leukocytes, UA: NEGATIVE
Nitrite: NEGATIVE
Protein, ur: 30 mg/dL — AB
Specific Gravity, Urine: 1.008 (ref 1.005–1.030)
Squamous Epithelial / HPF: NONE SEEN (ref 0–5)
WBC, UA: NONE SEEN WBC/hpf (ref 0–5)
pH: 6 (ref 5.0–8.0)

## 2018-11-10 LAB — BASIC METABOLIC PANEL
Anion gap: 7 (ref 5–15)
BUN: 20 mg/dL (ref 6–20)
CO2: 27 mmol/L (ref 22–32)
Calcium: 8.7 mg/dL — ABNORMAL LOW (ref 8.9–10.3)
Chloride: 105 mmol/L (ref 98–111)
Creatinine, Ser: 1.31 mg/dL — ABNORMAL HIGH (ref 0.61–1.24)
GFR calc Af Amer: >60 mL/min (ref >60)
GFR calc non Af Amer: >60 mL/min (ref >60)
Glucose, Bld: 87 mg/dL (ref 70–99)
Potassium: 3.5 mmol/L (ref 3.5–5.1)
Sodium: 139 mmol/L (ref 135–145)

## 2018-11-10 LAB — URINE DRUG SCREEN, QUALITATIVE (ARMC ONLY)
Amphetamines, Ur Screen: NOT DETECTED
BENZODIAZEPINE, UR SCRN: NOT DETECTED
Barbiturates, Ur Screen: NOT DETECTED
Cannabinoid 50 Ng, Ur ~~LOC~~: NOT DETECTED
Cocaine Metabolite,Ur ~~LOC~~: NOT DETECTED
MDMA (Ecstasy)Ur Screen: NOT DETECTED
Methadone Scn, Ur: NOT DETECTED
Opiate, Ur Screen: NOT DETECTED
Phencyclidine (PCP) Ur S: NOT DETECTED
TRICYCLIC, UR SCREEN: NOT DETECTED

## 2018-11-10 LAB — LIPID PANEL
Cholesterol: 191 mg/dL (ref 0–200)
HDL: 35 mg/dL — ABNORMAL LOW (ref >40)
LDL Cholesterol: 144 mg/dL — ABNORMAL HIGH (ref 0–99)
Total CHOL/HDL Ratio: 5.5 RATIO
Triglycerides: 60 mg/dL (ref <150)
VLDL: 12 mg/dL (ref 0–40)

## 2018-11-10 LAB — ETHANOL

## 2018-11-10 LAB — HEMOGLOBIN A1C
Hgb A1c MFr Bld: 5.3 % (ref 4.8–5.6)
Mean Plasma Glucose: 105.41 mg/dL

## 2018-11-10 LAB — PROTIME-INR
INR: 0.99
Prothrombin Time: 13 seconds (ref 11.4–15.2)

## 2018-11-10 MED ORDER — SODIUM CHLORIDE 0.9 % IV SOLN: INTRAVENOUS | Status: DC

## 2018-11-10 MED ORDER — ACETAMINOPHEN 325 MG PO TABS: 650.0000 mg | ORAL_TABLET | ORAL | Status: DC | PRN

## 2018-11-10 MED ORDER — ACETAMINOPHEN 160 MG/5ML PO SOLN
650.0000 mg | ORAL | Status: DC | PRN
  Filled 2018-11-10: qty 20.3

## 2018-11-10 MED ORDER — IOPAMIDOL (ISOVUE-370) INJECTION 76%
115.0000 mL | Freq: Once | INTRAVENOUS | Status: AC | PRN
  Administered 2018-11-10: 115 mL via INTRAVENOUS

## 2018-11-10 MED ORDER — SENNOSIDES-DOCUSATE SODIUM 8.6-50 MG PO TABS: 1.0000 | ORAL_TABLET | Freq: Every evening | ORAL | Status: DC | PRN

## 2018-11-10 MED ORDER — ASPIRIN EC 81 MG PO TBEC: 81.0000 mg | DELAYED_RELEASE_TABLET | Freq: Every day | ORAL | Status: DC

## 2018-11-10 MED ORDER — ALBUTEROL SULFATE (2.5 MG/3ML) 0.083% IN NEBU: 2.5000 mg | INHALATION_SOLUTION | RESPIRATORY_TRACT | Status: DC | PRN

## 2018-11-10 MED ORDER — STROKE: EARLY STAGES OF RECOVERY BOOK
Freq: Once | Status: AC
  Administered 2018-11-10: 09:00:00

## 2018-11-10 MED ORDER — ACETAMINOPHEN 650 MG RE SUPP: 650.0000 mg | RECTAL | Status: DC | PRN

## 2018-11-10 MED ORDER — ATORVASTATIN CALCIUM 20 MG PO TABS: 10.0000 mg | ORAL_TABLET | Freq: Every day | ORAL | Status: DC

## 2018-11-10 MED ORDER — HEPARIN SODIUM (PORCINE) 5000 UNIT/ML IJ SOLN: 5000.0000 [IU] | Freq: Three times a day (TID) | INTRAMUSCULAR | Status: DC

## 2018-11-10 NOTE — ED Notes (Signed)
Patient transported to room 128

## 2018-11-10 NOTE — Consult Note (Signed)
Referring Physician: Sudini    Chief Complaint: Right arm weakness  HPI: Tyler Mathews is an 57 y.o. male with a history of multiple strokes in the past who presented with complaints of right arm weakness.  The patient has difficulty with speech as well as chronic right-sided weakness as residual deficit from stroke history that was reported at admission but today he reports that despite his multiple strokes in the past he had no deficit.  He reports that when he awakened yesterday morning with his right arm was weaker than the left and progressively worsened throughout the day.  Now he states that his "right arm feels heavy".  He denies chest pain or shortness of breath.    Date last known well: Date: 11/09/2018 Time last known well: Time: 00:00 tPA Given: No: Outside time window  Past Medical History:  Diagnosis Date  . Asthma   . Carotid artery obstruction, left   . Clot    behind right eye  . High cholesterol   . Hypertension   . Stroke (HCC)   . TIA (transient ischemic attack)     History reviewed. No pertinent surgical history.  Family History  Problem Relation Age of Onset  . Heart disease Mother   . Stroke Father   . Stroke Brother    Social History:  reports that he has been smoking. He has a 12.00 pack-year smoking history. He has never used smokeless tobacco. He reports previous alcohol use. He reports previous drug use.  Allergies:  Allergies  Allergen Reactions  . Codeine Diarrhea    Medications:  I have reviewed the patient's current medications. Prior to Admission:  No medications prior to admission.   Scheduled: . aspirin EC  81 mg Oral Daily  . atorvastatin  10 mg Oral q1800  . heparin  5,000 Units Subcutaneous Q8H    ROS: History obtained from the patient  General ROS: negative for - chills, fatigue, fever, night sweats, weight gain or weight loss Psychological ROS: negative for - behavioral disorder, hallucinations, memory difficulties, mood  swings or suicidal ideation Ophthalmic ROS: negative for - blurry vision, double vision, eye pain or loss of vision ENT ROS: negative for - epistaxis, nasal discharge, oral lesions, sore throat, tinnitus or vertigo Allergy and Immunology ROS: negative for - hives or itchy/watery eyes Hematological and Lymphatic ROS: negative for - bleeding problems, bruising or swollen lymph nodes Endocrine ROS: negative for - galactorrhea, hair pattern changes, polydipsia/polyuria or temperature intolerance Respiratory ROS: negative for - cough, hemoptysis, shortness of breath or wheezing Cardiovascular ROS: negative for - chest pain, dyspnea on exertion, edema or irregular heartbeat Gastrointestinal ROS: negative for - abdominal pain, diarrhea, hematemesis, nausea/vomiting or stool incontinence Genito-Urinary ROS: negative for - dysuria, hematuria, incontinence or urinary frequency/urgency Musculoskeletal ROS: negative for - joint swelling or muscular weakness Neurological ROS: as noted in HPI Dermatological ROS: negative for rash and skin lesion changes  Physical Examination: Blood pressure (!) 147/70, pulse 76, temperature 99.7 F (37.6 C), temperature source Oral, resp. rate (!) 36, height 5\' 9"  (1.753 m), weight 75.3 kg, SpO2 95 %.  HEENT-  Normocephalic, no lesions, without obvious abnormality.  Normal external eye and conjunctiva.  Normal TM's bilaterally.  Normal auditory canals and external ears. Normal external nose, mucus membranes and septum.  Normal pharynx. Cardiovascular- S1, S2 normal, pulses palpable throughout   Lungs- chest clear, no wheezing, rales, normal symmetric air entry Abdomen- soft, non-tender; bowel sounds normal; no masses,  no organomegaly Extremities- no  edema Lymph-no adenopathy palpable Musculoskeletal-no joint tenderness, deformity or swelling Skin-warm and dry, no hyperpigmentation, vitiligo, or suspicious lesions  Neurological Examination   Mental Status: Alert,  oriented, thought content appropriate.  Speech fluent without evidence of aphasia.  Able to follow 3 step commands without difficulty. Cranial Nerves: II: Discs flat bilaterally; Visual fields grossly normal, pupils equal, round, reactive to light and accommodation III,IV, VI: ptosis not present, extra-ocular motions intact bilaterally V,VII: mild right facial droop, facial light touch sensation normal bilaterally VIII: hearing normal bilaterally IX,X: gag reflex present XI: bilateral shoulder shrug XII: midline tongue extension Motor: Right : Upper extremity   3+/5    Left:     Upper extremity   5/5  Lower extremity   5-/5     Lower extremity   5/5 Tone and bulk:normal tone throughout; no atrophy noted Sensory: Pinprick and light touch intact throughout, bilaterally Deep Tendon Reflexes: 2+ and symmetric throughout Cerebellar: Normal finger-to-nose and normal heel-to-shin testing bilaterally Gait: not tested due to safety concerns    Laboratory Studies:  Basic Metabolic Panel: Recent Labs  Lab 11/10/18 0009  NA 139  K 3.5  CL 105  CO2 27  GLUCOSE 87  BUN 20  CREATININE 1.31*  CALCIUM 8.7*    Liver Function Tests: No results for input(s): AST, ALT, ALKPHOS, BILITOT, PROT, ALBUMIN in the last 168 hours. No results for input(s): LIPASE, AMYLASE in the last 168 hours. No results for input(s): AMMONIA in the last 168 hours.  CBC: No results for input(s): WBC, NEUTROABS, HGB, HCT, MCV, PLT in the last 168 hours.  Cardiac Enzymes: No results for input(s): CKTOTAL, CKMB, CKMBINDEX, TROPONINI in the last 168 hours.  BNP: Invalid input(s): POCBNP  CBG: Recent Labs  Lab 11/09/18 2335  GLUCAP 104*    Microbiology: No results found for this or any previous visit.  Coagulation Studies: Recent Labs    11/10/18 0009  LABPROT 13.0  INR 0.99    Urinalysis:  Recent Labs  Lab 11/10/18 0009  COLORURINE STRAW*  LABSPEC 1.008  PHURINE 6.0  GLUCOSEU NEGATIVE  HGBUR  SMALL*  BILIRUBINUR NEGATIVE  KETONESUR NEGATIVE  PROTEINUR 30*  NITRITE NEGATIVE  LEUKOCYTESUR NEGATIVE    Lipid Panel:    Component Value Date/Time   CHOL 191 11/10/2018 0829   CHOL 132 05/21/2018 1910   TRIG 60 11/10/2018 0829   HDL 35 (L) 11/10/2018 0829   HDL 42 05/21/2018 1910   CHOLHDL 5.5 11/10/2018 0829   VLDL 12 11/10/2018 0829   LDLCALC 144 (H) 11/10/2018 0829   LDLCALC 61 05/21/2018 1910    HgbA1C:  Lab Results  Component Value Date   HGBA1C 5.7 (H) 05/21/2018    Urine Drug Screen:      Component Value Date/Time   LABOPIA NONE DETECTED 11/10/2018 0009   COCAINSCRNUR NONE DETECTED 11/10/2018 0009   LABBENZ NONE DETECTED 11/10/2018 0009   AMPHETMU NONE DETECTED 11/10/2018 0009   THCU NONE DETECTED 11/10/2018 0009   LABBARB NONE DETECTED 11/10/2018 0009    Alcohol Level:  Recent Labs  Lab 11/10/18 0009  ETH <10    Imaging: Ct Angio Head W Or Wo Contrast  Result Date: 11/10/2018 CLINICAL DATA:  RIGHT-sided weakness for 6 hours. EXAM: CT ANGIOGRAPHY HEAD AND NECK CT PERFUSION BRAIN TECHNIQUE: Multidetector CT imaging of the head and neck was performed using the standard protocol during bolus administration of intravenous contrast. Multiplanar CT image reconstructions and MIPs were obtained to evaluate the vascular anatomy. Carotid stenosis  measurements (when applicable) are obtained utilizing NASCET criteria, using the distal internal carotid diameter as the denominator. Multiphase CT imaging of the brain was performed following IV bolus contrast injection. Subsequent parametric perfusion maps were calculated using RAPID software. CONTRAST:  ISOVUE-370 IOPAMIDOL (ISOVUE-370) INJECTION 76% COMPARISON:  CT HEAD November 09, 2018 FINDINGS: CTA NECK FINDINGS: AORTIC ARCH: Normal appearance of the thoracic arch, moderate intimal thickening. The origins of the innominate, left Common carotid artery and subclavian artery are patent, mild stenosis innominate and  LEFT carotid artery origins. RIGHT CAROTID SYSTEM: Common carotid artery is patent. RIGHT internal carotid artery occluded at the origin without reconstitution in the neck. LEFT CAROTID SYSTEM: Common carotid artery is patent. 11 mm segment critical stenosis LEFT ICA origin with string sign. Patent internal carotid artery. VERTEBRAL ARTERIES:RIGHT vertebral artery is dominant. Severe stenosis LEFT vertebral artery origin. SKELETON: No acute osseous process though bone windows have not been submitted. Mild cervical spondylosis. OTHER NECK: Soft tissues of the neck are nonacute though, not tailored for evaluation. UPPER CHEST: Included lung apices are clear. Centrilobular emphysema. No superior mediastinal lymphadenopathy. CTA HEAD FINDINGS: ANTERIOR CIRCULATION: Reconstitution RIGHT ICA at supraclinoid segment likely via retrograde flow RIGHT ophthalmic artery. Patent LEFT internal carotid artery. Patent anterior middle cerebral arteries with moderate luminal irregularity. No large vessel occlusion, significant stenosis, contrast extravasation or aneurysm. POSTERIOR CIRCULATION: Patent vertebral arteries, vertebrobasilar junction and basilar artery, as well as main branch vessels. Patent posterior cerebral arteries. Diminutive LEFT PCA, patent posterior cerebral arteries with moderate luminal irregularity. No large vessel occlusion, significant stenosis, contrast extravasation or aneurysm. VENOUS SINUSES: Major dural venous sinuses are patent though not tailored for evaluation on this angiographic examination. ANATOMIC VARIANTS: Hypoplastic RIGHT A1 segment. Predominately azygos ACA. DELAYED PHASE: Not performed. MIP images reviewed. CT Brain Perfusion Findings: CBF (<30%) Volume: 0mL Perfusion (Tmax>6.0s) volume: 17mL Mismatch Volume: 17mL Infarction Location:RIGHT frontal lobe however degraded by motion. IMPRESSION: CTA NECK: 1. RIGHT internal carotid artery occluded at the origin, no reconstitution in the neck. 2.  Critical stenosis LEFT ICA, patent vessel. 3. Severe stenosis LEFT vertebral artery origin, patent vertebral arteries. CTA HEAD: 1. No emergent large vessel occlusion; reconstitution RIGHT ICA supraclinoid segment. 2. No flow-limiting stenosis. Moderate intracranial atherosclerosis. 3. Diminutive LEFT PCA, likely chronic. CT PERFUSION: 1. RIGHT frontal lobe penumbra, however at this corresponds to areas of infarct and is likely artifact. Critical Value/emergent results were called by telephone at the time of interpretation on 11/10/2018 at 1:10 am to Dr. Merrily Brittle , who verbally acknowledged these results. Emphysema (ICD10-J43.9). Electronically Signed   By: Awilda Metro M.D.   On: 11/10/2018 01:11   Ct Angio Neck W Or Wo Contrast  Result Date: 11/10/2018 CLINICAL DATA:  RIGHT-sided weakness for 6 hours. EXAM: CT ANGIOGRAPHY HEAD AND NECK CT PERFUSION BRAIN TECHNIQUE: Multidetector CT imaging of the head and neck was performed using the standard protocol during bolus administration of intravenous contrast. Multiplanar CT image reconstructions and MIPs were obtained to evaluate the vascular anatomy. Carotid stenosis measurements (when applicable) are obtained utilizing NASCET criteria, using the distal internal carotid diameter as the denominator. Multiphase CT imaging of the brain was performed following IV bolus contrast injection. Subsequent parametric perfusion maps were calculated using RAPID software. CONTRAST:  ISOVUE-370 IOPAMIDOL (ISOVUE-370) INJECTION 76% COMPARISON:  CT HEAD November 09, 2018 FINDINGS: CTA NECK FINDINGS: AORTIC ARCH: Normal appearance of the thoracic arch, moderate intimal thickening. The origins of the innominate, left Common carotid artery and subclavian artery are  patent, mild stenosis innominate and LEFT carotid artery origins. RIGHT CAROTID SYSTEM: Common carotid artery is patent. RIGHT internal carotid artery occluded at the origin without reconstitution in the  neck. LEFT CAROTID SYSTEM: Common carotid artery is patent. 11 mm segment critical stenosis LEFT ICA origin with string sign. Patent internal carotid artery. VERTEBRAL ARTERIES:RIGHT vertebral artery is dominant. Severe stenosis LEFT vertebral artery origin. SKELETON: No acute osseous process though bone windows have not been submitted. Mild cervical spondylosis. OTHER NECK: Soft tissues of the neck are nonacute though, not tailored for evaluation. UPPER CHEST: Included lung apices are clear. Centrilobular emphysema. No superior mediastinal lymphadenopathy. CTA HEAD FINDINGS: ANTERIOR CIRCULATION: Reconstitution RIGHT ICA at supraclinoid segment likely via retrograde flow RIGHT ophthalmic artery. Patent LEFT internal carotid artery. Patent anterior middle cerebral arteries with moderate luminal irregularity. No large vessel occlusion, significant stenosis, contrast extravasation or aneurysm. POSTERIOR CIRCULATION: Patent vertebral arteries, vertebrobasilar junction and basilar artery, as well as main branch vessels. Patent posterior cerebral arteries. Diminutive LEFT PCA, patent posterior cerebral arteries with moderate luminal irregularity. No large vessel occlusion, significant stenosis, contrast extravasation or aneurysm. VENOUS SINUSES: Major dural venous sinuses are patent though not tailored for evaluation on this angiographic examination. ANATOMIC VARIANTS: Hypoplastic RIGHT A1 segment. Predominately azygos ACA. DELAYED PHASE: Not performed. MIP images reviewed. CT Brain Perfusion Findings: CBF (<30%) Volume: 0mL Perfusion (Tmax>6.0s) volume: 17mL Mismatch Volume: 17mL Infarction Location:RIGHT frontal lobe however degraded by motion. IMPRESSION: CTA NECK: 1. RIGHT internal carotid artery occluded at the origin, no reconstitution in the neck. 2. Critical stenosis LEFT ICA, patent vessel. 3. Severe stenosis LEFT vertebral artery origin, patent vertebral arteries. CTA HEAD: 1. No emergent large vessel  occlusion; reconstitution RIGHT ICA supraclinoid segment. 2. No flow-limiting stenosis. Moderate intracranial atherosclerosis. 3. Diminutive LEFT PCA, likely chronic. CT PERFUSION: 1. RIGHT frontal lobe penumbra, however at this corresponds to areas of infarct and is likely artifact. Critical Value/emergent results were called by telephone at the time of interpretation on 11/10/2018 at 1:10 am to Dr. Merrily Brittle , who verbally acknowledged these results. Emphysema (ICD10-J43.9). Electronically Signed   By: Awilda Metro M.D.   On: 11/10/2018 01:11   Ct Cerebral Perfusion W Contrast  Result Date: 11/10/2018 CLINICAL DATA:  RIGHT-sided weakness for 6 hours. EXAM: CT ANGIOGRAPHY HEAD AND NECK CT PERFUSION BRAIN TECHNIQUE: Multidetector CT imaging of the head and neck was performed using the standard protocol during bolus administration of intravenous contrast. Multiplanar CT image reconstructions and MIPs were obtained to evaluate the vascular anatomy. Carotid stenosis measurements (when applicable) are obtained utilizing NASCET criteria, using the distal internal carotid diameter as the denominator. Multiphase CT imaging of the brain was performed following IV bolus contrast injection. Subsequent parametric perfusion maps were calculated using RAPID software. CONTRAST:  ISOVUE-370 IOPAMIDOL (ISOVUE-370) INJECTION 76% COMPARISON:  CT HEAD November 09, 2018 FINDINGS: CTA NECK FINDINGS: AORTIC ARCH: Normal appearance of the thoracic arch, moderate intimal thickening. The origins of the innominate, left Common carotid artery and subclavian artery are patent, mild stenosis innominate and LEFT carotid artery origins. RIGHT CAROTID SYSTEM: Common carotid artery is patent. RIGHT internal carotid artery occluded at the origin without reconstitution in the neck. LEFT CAROTID SYSTEM: Common carotid artery is patent. 11 mm segment critical stenosis LEFT ICA origin with string sign. Patent internal carotid  artery. VERTEBRAL ARTERIES:RIGHT vertebral artery is dominant. Severe stenosis LEFT vertebral artery origin. SKELETON: No acute osseous process though bone windows have not been submitted. Mild cervical spondylosis. OTHER NECK: Soft  tissues of the neck are nonacute though, not tailored for evaluation. UPPER CHEST: Included lung apices are clear. Centrilobular emphysema. No superior mediastinal lymphadenopathy. CTA HEAD FINDINGS: ANTERIOR CIRCULATION: Reconstitution RIGHT ICA at supraclinoid segment likely via retrograde flow RIGHT ophthalmic artery. Patent LEFT internal carotid artery. Patent anterior middle cerebral arteries with moderate luminal irregularity. No large vessel occlusion, significant stenosis, contrast extravasation or aneurysm. POSTERIOR CIRCULATION: Patent vertebral arteries, vertebrobasilar junction and basilar artery, as well as main branch vessels. Patent posterior cerebral arteries. Diminutive LEFT PCA, patent posterior cerebral arteries with moderate luminal irregularity. No large vessel occlusion, significant stenosis, contrast extravasation or aneurysm. VENOUS SINUSES: Major dural venous sinuses are patent though not tailored for evaluation on this angiographic examination. ANATOMIC VARIANTS: Hypoplastic RIGHT A1 segment. Predominately azygos ACA. DELAYED PHASE: Not performed. MIP images reviewed. CT Brain Perfusion Findings: CBF (<30%) Volume: 0mL Perfusion (Tmax>6.0s) volume: 17mL Mismatch Volume: 17mL Infarction Location:RIGHT frontal lobe however degraded by motion. IMPRESSION: CTA NECK: 1. RIGHT internal carotid artery occluded at the origin, no reconstitution in the neck. 2. Critical stenosis LEFT ICA, patent vessel. 3. Severe stenosis LEFT vertebral artery origin, patent vertebral arteries. CTA HEAD: 1. No emergent large vessel occlusion; reconstitution RIGHT ICA supraclinoid segment. 2. No flow-limiting stenosis. Moderate intracranial atherosclerosis. 3. Diminutive LEFT PCA, likely  chronic. CT PERFUSION: 1. RIGHT frontal lobe penumbra, however at this corresponds to areas of infarct and is likely artifact. Critical Value/emergent results were called by telephone at the time of interpretation on 11/10/2018 at 1:10 am to Dr. Merrily Brittle , who verbally acknowledged these results. Emphysema (ICD10-J43.9). Electronically Signed   By: Awilda Metro M.D.   On: 11/10/2018 01:11   Ct Head Code Stroke Wo Contrast`  Result Date: 11/09/2018 CLINICAL DATA:  Code stroke. Altered mental status. Ataxia. Suspect stroke. History of LEFT carotid artery occlusion, hypertension, hypercholesterolemia and TIA. EXAM: CT HEAD WITHOUT CONTRAST TECHNIQUE: Contiguous axial images were obtained from the base of the skull through the vertex without intravenous contrast. COMPARISON:  None. FINDINGS: BRAIN: No intraparenchymal hemorrhage, mass effect nor midline shift. RIGHT inferior frontal encephalomalacia with mild ex vacuo dilatation RIGHT lateral ventricle small area RIGHT parietal encephalomalacia. Mild-to-moderate parenchymal brain volume loss. Old bilateral basal ganglia and thalami lacunar infarcts. Patchy to confluent supratentorial white matter hypodensities. No abnormal extra-axial fluid collections. VASCULAR: Moderate to severe calcific atherosclerosis carotid siphon. SKULL/SOFT TISSUES: No skull fracture. No significant soft tissue swelling. ORBITS/SINUSES: The included ocular globes and orbital contents are normal.Trace RIGHT mastoid effusion. Minimal paranasal sinus mucosal thickening without air-fluid levels. OTHER: None. ASPECTS [facility] Stroke Program Early CT Score) - Ganglionic level infarction (caudate, lentiform nuclei, internal capsule, insula, M1-M3 cortex): 7 - Supraganglionic infarction (M4-M6 cortex): 3 Total score (0-10 with 10 being normal): 10 IMPRESSION: 1. No acute intracranial process. 2. ASPECTS is 10. 3. RIGHT frontal lobe encephalomalacia most compatible with TBI. 4. Old small  RIGHT parietal/MCA territory infarct. Multiple old basal ganglia and thalami lacunar infarcts. 5. Moderate chronic small vessel ischemic changes. 6. Moderate to severe atherosclerosis. 7. Critical Value/emergent results were called by telephone at the time of interpretation on 11/09/2018 at 11:53 pm to Dr. Lesly Rubenstein SUNG , who verbally acknowledged these results. Electronically Signed   By: Awilda Metro M.D.   On: 11/09/2018 23:53    Assessment: 57 y.o. male presenting with complaints of right sided weakness.  Initially reported that he had some residual right sided weakness from his previous infarcts.  Exam does suggest that this is the case.  Reports right arm weakness is new though.  Head CT reviewed and shows no acute changes.  CTA shows occluded left ICA and critically stenosed RICA.  Patient reports that he is aware of this and has been told that he is nonsurgical in the past.  Reports being compliant with ASA but will not stop smoking.  LDL 144.  A1c and echocardiogram are pending.    Stroke Risk Factors - carotid stenosis, hyperlipidemia, hypertension and smoking  Plan: 1. HgbA1c pending 2. MRI, MRA  of the brain pending 3. PT consult, OT consult, Speech consult 4. Echocardiogram pending 5. Prophylactic therapy-ASA 81mg  and Plavix 75mg  daily 6. NPO until RN stroke swallow screen 7. Telemetry monitoring 8. Frequent neuro checks  Thana FarrLeslie Ayza Ripoll, MD Neurology [phone] 11/10/2018, 9:43 AM  Addendum: Patient requesting to sign out AMA.  Will need to have work up completed on an outpatient basis.    Thana FarrLeslie Jatia Musa, MD Neurology [phone]

## 2018-11-10 NOTE — Progress Notes (Signed)
Pt insists on going outside to smoke, [facility] policy was reviewed with him and he decided he would leave AMA, form was signed, IV taken out and Tele box was returned to staff.

## 2018-11-10 NOTE — Consult Note (Signed)
[facility]   TeleStroke Metrics: LKW: 0000 on 11/09/18 Door Time: 2329 [facility] Contacted: 0048  [facility] at Bedside: 0056 NIHSS: 0101 Decision on Alteplase: Not to give as his last known well time is greater than 4.5 hours prior to his presentation. Interventional Candidate: Not a candidate as his symptoms are not consistent with a large vessel proximal occlusion.   Chief Complaint: Right arm weakness   HPI: Asked to see this patient in emergent telemedicine consultation utilizing interactive audio and video technologies. ?Consultation was performed with assistance of ancillary / medical staff at bedside.   Verbal consent to perform the examination with telemedicine was obtained. Patient agreed to proceed with the consultation for acute stroke protocol.  57 year old right-handed white male who comes to the emergency room for worsening right arm weakness.  Patient reports a history of 17 strokes and TIAs in the past.  He notes chronic right-sided weakness and problems with his memory and speech since his stroke.  Patient takes a baby aspirin.  He is an active smoker.  Patient was fine and at his baseline when he went to bed at midnight last night on 12/14.  He woke up around 9 AM this morning and had some right arm weakness.  He denies any recent neck trauma.  He does report some neck pain.  Apparently as the day went on, he had increasing right arm weakness.  ER team noted that he came from a bar and was drinking.  PMH: Patient reports 17 strokes/TIAs in the past and notes chronic right-sided weakness and problems with his speech and memory; asthma; GERD; carotid artery disease; hypertension; and hyperlipidemia.   SOC: Positive for tobacco abuse and alcohol use.  Negative for drug use.  He lives alone.   FMH: Both his brother and father have had strokes before.  Family history also positive for heart disease.   ROS: 13 point review systems  were reviewed with the patient, and are all negative with the exception of the aforementioned in the history of present illness.   VS: Blood pressure 171/75, pulse 61, respiration 23, oxygen saturation 96%   Exam: Patient is in no apparent distress.  Patient appears as stated age.  No obvious acute respiratory or cardiac distress.  Patient is well groomed and well-nourished. 1a- LOC: Keenly responsive - 0     1b- LOC questions: Answers both questions correctly - 0    1c- LOC commands- Performs both tasks correctly- 0    2- Gaze: Normal; no gaze paresis or gaze deviation - 0    3- Visual Fields: normal, no Visual field deficit - 0    4- Facial movements: no facial palsy - 0    5- Upper limb motor - right arm drift - 1    6- Lower limb motor - no drift - 0     7- Limb Coordination: absent ataxia - 0     8- Sensory: no sensory loss - 0     9- Language - Mild expressive aphasia - 1    10- Speech - Mild dysarthria - 1  11- Neglect / Extinction - none found - 0   NIHSS score: 3    Diagnostic Data: CT of the head showed no acute intracranial process.  There is noted to be prior right MCA strokes and bilateral subcortical strokes.   Medical Data Reviewed:   1.Data?reviewed include clinical labs, radiology,?and medical tests;   2.Tests?results discussed w/performing or interpreting physician;   3.Obtaining/reviewing old  medical records;   4.Obtaining?case history from another source;   5.Independent?review of image, tracing, or specimen.    Medical Decision Making:   - Extensive number of diagnosis or management options are considered below.   - Extensive amount of complex data reviewed.   - High risk of complication and/or morbidity or mortality are associated with differential diagnostic considerations below.   - There may be?uncertain?outcome and increased probability of prolonged functional impairment or high probability of severe prolonged functional impairment associated with some of  these differential diagnosis.    Differential Diagnosis for Stroke:   1.?Cardioembolic?stroke   2. Small vessel disease/lacune   3. Thromboembolic, artery-to-artery mechanism   4.?Hypercoagulable?state-related infarct   5. Transient ischemic attack   6. Thrombotic mechanism, large artery disease    Assessment: 1.  Possible left MCA stroke versus recrudescence of prior stroke symptoms 2.  Prior right MCA and bilateral subcortical strokes 3.  Hypertension 4.  Hyperlipidemia 5.  Asthma 6.  GERD 7.  Tobacco abuse   Recommendations: Patient can be admitted to the hospital for further work-up of his symptoms. Can maintain the patient on a full dose aspirin 325 mg daily for now. Allow permissive hypertension. Metabolic and infectious work-up per primary team.  Will need to rule out any underlying subclinical infectious process that could be causing recrudescence of prior stroke symptoms. Check MRI brain without contrast to rule out any acute intracranial process. Check echocardiogram to gauge his cardiac function. Maintain the patient on telemetry to look for paroxysmal atrial fibrillation. Check hemoglobin A1c, lipid panel, and urine drug screen. Consult PT, OT, and ST. Consult local neurology team to assist with evaluation and management. Continue supportive care. Plan of care was discussed with the patient.   Thank you for allowing [facility] to participate in the care of your patient. Please call me, Dr. Adrienne Mocha, with any questions at [phone]. Case discussed with the ER staff and Dr. Lamont Snowball.   Critical Care notation:   I was called to see this critical patient emergently. I personally evaluated this critical patient for acute stroke evaluation, and determining their eligibility for IV Alteplase and interventional therapies.  I have spent approximately 7 minutes with the patient, including time at bedside, time discussing the case with other physicians, reviewing plan  of care, and time independently reviewing the records and scans.

## 2018-11-10 NOTE — H&P (Signed)
Tyler Mathews is an 57 y.o. male.   Chief Complaint: Weakness HPI: The patient with past medical history of multiple strokes presents to the emergency department complaining of right arm weakness.  The patient has difficulty with speech as well as chronic right-sided weakness as residual deficit from stroke history.  He reports that when he awoke this morning his right arm was weaker than the left and progressively worsened throughout the day.  Now he states that his "right arm feels heavy".  He denies chest pain or shortness of breath.  Notably the patient presented to the emergency department from a bar.  He was given chewable aspirin in the emergency department prior to the hospitalist service being called for admission.  Past Medical History:  Diagnosis Date  . Asthma   . Carotid artery obstruction, left   . Clot    behind right eye  . High cholesterol   . Hypertension   . Stroke (HCC)   . TIA (transient ischemic attack)     No past surgical history on file.  None  Family History  Problem Relation Age of Onset  . Heart disease Mother   . Stroke Father   . Stroke Brother    Social History:  reports that he has been smoking. He has a 12.00 pack-year smoking history. He has never used smokeless tobacco. He reports previous alcohol use. He reports previous drug use.  Allergies:  Allergies  Allergen Reactions  . Codeine Diarrhea    Prior to Admission medications   Medication Sig Start Date End Date Taking? Authorizing Provider  albuterol (PROVENTIL HFA;VENTOLIN HFA) 108 (90 Base) MCG/ACT inhaler Inhale 1 puff into the lungs every 4 (four) hours as needed for wheezing or shortness of breath. 04/04/18  Yes Tukov-Yual, Alroy Bailiff, NP  aspirin EC 81 MG tablet TAKE ONE TABLET BY MOUTH EVERY DAY 07/31/18  Yes Chaplin, Jimmie Molly, MD  atorvastatin (LIPITOR) 10 MG tablet TAKE ONE TABLET BY MOUTH EVERY DAY AT 6:00pm Patient taking differently: Take 10 mg by mouth daily at 6 PM.  07/31/18  Yes  Chaplin, Jimmie Molly, MD  hydrochlorothiazide (MICROZIDE) 12.5 MG capsule Take 1 capsule (12.5 mg total) by mouth daily. 04/04/18  Yes Tukov-Yual, Magdalene S, NP  lisinopril (PRINIVIL,ZESTRIL) 20 MG tablet Take 1 tablet (20 mg total) by mouth at bedtime. 04/04/18  Yes Tukov-Yual, Alroy Bailiff, NP  omeprazole (PRILOSEC) 20 MG capsule Take 1 capsule (20 mg total) by mouth daily. Patient taking differently: Take 20 mg by mouth daily as needed (reflux).  09/24/18  Yes Doles-Johnson, Teah, NP  Meclizine HCl 25 MG CHEW Chew 1 tablet (25 mg total) by mouth daily. Patient taking differently: Chew 25 mg by mouth daily as needed (dizziness).  05/21/18   Kallie Locks, FNP  tiotropium (SPIRIVA) 18 MCG inhalation capsule Place 1 capsule (18 mcg total) into inhaler and inhale daily. Patient not taking: Reported on 05/21/2018 04/04/18   Andreas Ohm, NP     Results for orders placed or performed during the hospital encounter of 11/09/18 (from the past 48 hour(s))  Glucose, capillary     Status: Abnormal   Collection Time: 11/09/18 11:35 PM  Result Value Ref Range   Glucose-Capillary 104 (H) 70 - 99 mg/dL  Basic metabolic panel     Status: Abnormal   Collection Time: 11/10/18 12:09 AM  Result Value Ref Range   Sodium 139 135 - 145 mmol/L   Potassium 3.5 3.5 - 5.1 mmol/L   Chloride 105  98 - 111 mmol/L   CO2 27 22 - 32 mmol/L   Glucose, Bld 87 70 - 99 mg/dL   BUN 20 6 - 20 mg/dL   Creatinine, Ser 1.611.31 (H) 0.61 - 1.24 mg/dL   Calcium 8.7 (L) 8.9 - 10.3 mg/dL   GFR calc non Af Amer >60 >60 mL/min   GFR calc Af Amer >60 >60 mL/min   Anion gap 7 5 - 15    Comment: Performed at [facility], [address] [place], [place] [address]  Ethanol     Status: None   Collection Time: 11/10/18 12:09 AM  Result Value Ref Range   Alcohol, Ethyl (B) <10 <10 mg/dL    Comment: (NOTE) Lowest detectable limit for serum alcohol is 10 mg/dL. For medical purposes only. Performed at [facility],  [address] [place], [place] [address]   Urine Drug Screen, Qualitative     Status: None   Collection Time: 11/10/18 12:09 AM  Result Value Ref Range   Tricyclic, Ur Screen NONE DETECTED NONE DETECTED   Amphetamines, Ur Screen NONE DETECTED NONE DETECTED   MDMA (Ecstasy)Ur Screen NONE DETECTED NONE DETECTED   Cocaine Metabolite,Ur [place] NONE DETECTED NONE DETECTED   Opiate, Ur Screen NONE DETECTED NONE DETECTED   Phencyclidine (PCP) Ur S NONE DETECTED NONE DETECTED   Cannabinoid 50 Ng, Ur [place] NONE DETECTED NONE DETECTED   Barbiturates, Ur Screen NONE DETECTED NONE DETECTED   Benzodiazepine, Ur Scrn NONE DETECTED NONE DETECTED   Methadone Scn, Ur NONE DETECTED NONE DETECTED    Comment: (NOTE) Tricyclics + metabolites, urine    Cutoff 1000 ng/mL Amphetamines + metabolites, urine  Cutoff 1000 ng/mL MDMA (Ecstasy), urine              Cutoff 500 ng/mL Cocaine Metabolite, urine          Cutoff 300 ng/mL Opiate + metabolites, urine        Cutoff 300 ng/mL Phencyclidine (PCP), urine         Cutoff 25 ng/mL Cannabinoid, urine                 Cutoff 50 ng/mL Barbiturates + metabolites, urine  Cutoff 200 ng/mL Benzodiazepine, urine              Cutoff 200 ng/mL Methadone, urine                   Cutoff 300 ng/mL The urine drug screen provides only a preliminary, unconfirmed analytical test result and should not be used for non-medical purposes. Clinical consideration and professional judgment should be applied to any positive drug screen result due to possible interfering substances. A more specific alternate chemical method must be used in order to obtain a confirmed analytical result. Gas chromatography / mass spectrometry (GC/MS) is the preferred confirmat ory method. Performed at [facility], [address] [place], [place] [address]   Urinalysis, Routine w reflex microscopic     Status: Abnormal   Collection Time: 11/10/18 12:09 AM  Result Value Ref Range   Color,  Urine STRAW (A) YELLOW   APPearance CLEAR (A) CLEAR   Specific Gravity, Urine 1.008 1.005 - 1.030   pH 6.0 5.0 - 8.0   Glucose, UA NEGATIVE NEGATIVE mg/dL   Hgb urine dipstick SMALL (A) NEGATIVE   Bilirubin Urine NEGATIVE NEGATIVE   Ketones, ur NEGATIVE NEGATIVE mg/dL   Protein, ur 30 (A) NEGATIVE mg/dL   Nitrite NEGATIVE NEGATIVE  Leukocytes, UA NEGATIVE NEGATIVE   RBC / HPF 0-5 0 - 5 RBC/hpf   WBC, UA NONE SEEN 0 - 5 WBC/hpf   Bacteria, UA NONE SEEN NONE SEEN   Squamous Epithelial / LPF NONE SEEN 0 - 5   Mucus PRESENT     Comment: Performed at [facility], [address] [place], [place] [address]  Protime-INR     Status: None   Collection Time: 11/10/18 12:09 AM  Result Value Ref Range   Prothrombin Time 13.0 11.4 - 15.2 seconds   INR 0.99     Comment: Performed at [facility], [address], [place], [place] [address]   Ct Angio Head W Or Wo Contrast  Result Date: 11/10/2018 CLINICAL DATA:  RIGHT-sided weakness for 6 hours. EXAM: CT ANGIOGRAPHY HEAD AND NECK CT PERFUSION BRAIN TECHNIQUE: Multidetector CT imaging of the head and neck was performed using the standard protocol during bolus administration of intravenous contrast. Multiplanar CT image reconstructions and MIPs were obtained to evaluate the vascular anatomy. Carotid stenosis measurements (when applicable) are obtained utilizing NASCET criteria, using the distal internal carotid diameter as the denominator. Multiphase CT imaging of the brain was performed following IV bolus contrast injection. Subsequent parametric perfusion maps were calculated using RAPID software. CONTRAST:  ISOVUE-370 IOPAMIDOL (ISOVUE-370) INJECTION 76% COMPARISON:  CT HEAD November 09, 2018 FINDINGS: CTA NECK FINDINGS: AORTIC ARCH: Normal appearance of the thoracic arch, moderate intimal thickening. The origins of the innominate, left Common carotid artery and subclavian artery are patent, mild stenosis innominate and LEFT  carotid artery origins. RIGHT CAROTID SYSTEM: Common carotid artery is patent. RIGHT internal carotid artery occluded at the origin without reconstitution in the neck. LEFT CAROTID SYSTEM: Common carotid artery is patent. 11 mm segment critical stenosis LEFT ICA origin with string sign. Patent internal carotid artery. VERTEBRAL ARTERIES:RIGHT vertebral artery is dominant. Severe stenosis LEFT vertebral artery origin. SKELETON: No acute osseous process though bone windows have not been submitted. Mild cervical spondylosis. OTHER NECK: Soft tissues of the neck are nonacute though, not tailored for evaluation. UPPER CHEST: Included lung apices are clear. Centrilobular emphysema. No superior mediastinal lymphadenopathy. CTA HEAD FINDINGS: ANTERIOR CIRCULATION: Reconstitution RIGHT ICA at supraclinoid segment likely via retrograde flow RIGHT ophthalmic artery. Patent LEFT internal carotid artery. Patent anterior middle cerebral arteries with moderate luminal irregularity. No large vessel occlusion, significant stenosis, contrast extravasation or aneurysm. POSTERIOR CIRCULATION: Patent vertebral arteries, vertebrobasilar junction and basilar artery, as well as main branch vessels. Patent posterior cerebral arteries. Diminutive LEFT PCA, patent posterior cerebral arteries with moderate luminal irregularity. No large vessel occlusion, significant stenosis, contrast extravasation or aneurysm. VENOUS SINUSES: Major dural venous sinuses are patent though not tailored for evaluation on this angiographic examination. ANATOMIC VARIANTS: Hypoplastic RIGHT A1 segment. Predominately azygos ACA. DELAYED PHASE: Not performed. MIP images reviewed. CT Brain Perfusion Findings: CBF (<30%) Volume: 0mL Perfusion (Tmax>6.0s) volume: 17mL Mismatch Volume: 17mL Infarction Location:RIGHT frontal lobe however degraded by motion. IMPRESSION: CTA NECK: 1. RIGHT internal carotid artery occluded at the origin, no reconstitution in the neck. 2.  Critical stenosis LEFT ICA, patent vessel. 3. Severe stenosis LEFT vertebral artery origin, patent vertebral arteries. CTA HEAD: 1. No emergent large vessel occlusion; reconstitution RIGHT ICA supraclinoid segment. 2. No flow-limiting stenosis. Moderate intracranial atherosclerosis. 3. Diminutive LEFT PCA, likely chronic. CT PERFUSION: 1. RIGHT frontal lobe penumbra, however at this corresponds to areas of infarct and is likely artifact. Critical Value/emergent results were called by telephone at the time of  interpretation on 11/10/2018 at 1:10 am to Dr. Merrily Brittle , who verbally acknowledged these results. Emphysema (ICD10-J43.9). Electronically Signed   By: Awilda Metro M.D.   On: 11/10/2018 01:11   Ct Angio Neck W Or Wo Contrast  Result Date: 11/10/2018 CLINICAL DATA:  RIGHT-sided weakness for 6 hours. EXAM: CT ANGIOGRAPHY HEAD AND NECK CT PERFUSION BRAIN TECHNIQUE: Multidetector CT imaging of the head and neck was performed using the standard protocol during bolus administration of intravenous contrast. Multiplanar CT image reconstructions and MIPs were obtained to evaluate the vascular anatomy. Carotid stenosis measurements (when applicable) are obtained utilizing NASCET criteria, using the distal internal carotid diameter as the denominator. Multiphase CT imaging of the brain was performed following IV bolus contrast injection. Subsequent parametric perfusion maps were calculated using RAPID software. CONTRAST:  ISOVUE-370 IOPAMIDOL (ISOVUE-370) INJECTION 76% COMPARISON:  CT HEAD November 09, 2018 FINDINGS: CTA NECK FINDINGS: AORTIC ARCH: Normal appearance of the thoracic arch, moderate intimal thickening. The origins of the innominate, left Common carotid artery and subclavian artery are patent, mild stenosis innominate and LEFT carotid artery origins. RIGHT CAROTID SYSTEM: Common carotid artery is patent. RIGHT internal carotid artery occluded at the origin without reconstitution in the  neck. LEFT CAROTID SYSTEM: Common carotid artery is patent. 11 mm segment critical stenosis LEFT ICA origin with string sign. Patent internal carotid artery. VERTEBRAL ARTERIES:RIGHT vertebral artery is dominant. Severe stenosis LEFT vertebral artery origin. SKELETON: No acute osseous process though bone windows have not been submitted. Mild cervical spondylosis. OTHER NECK: Soft tissues of the neck are nonacute though, not tailored for evaluation. UPPER CHEST: Included lung apices are clear. Centrilobular emphysema. No superior mediastinal lymphadenopathy. CTA HEAD FINDINGS: ANTERIOR CIRCULATION: Reconstitution RIGHT ICA at supraclinoid segment likely via retrograde flow RIGHT ophthalmic artery. Patent LEFT internal carotid artery. Patent anterior middle cerebral arteries with moderate luminal irregularity. No large vessel occlusion, significant stenosis, contrast extravasation or aneurysm. POSTERIOR CIRCULATION: Patent vertebral arteries, vertebrobasilar junction and basilar artery, as well as main branch vessels. Patent posterior cerebral arteries. Diminutive LEFT PCA, patent posterior cerebral arteries with moderate luminal irregularity. No large vessel occlusion, significant stenosis, contrast extravasation or aneurysm. VENOUS SINUSES: Major dural venous sinuses are patent though not tailored for evaluation on this angiographic examination. ANATOMIC VARIANTS: Hypoplastic RIGHT A1 segment. Predominately azygos ACA. DELAYED PHASE: Not performed. MIP images reviewed. CT Brain Perfusion Findings: CBF (<30%) Volume: 0mL Perfusion (Tmax>6.0s) volume: 17mL Mismatch Volume: 17mL Infarction Location:RIGHT frontal lobe however degraded by motion. IMPRESSION: CTA NECK: 1. RIGHT internal carotid artery occluded at the origin, no reconstitution in the neck. 2. Critical stenosis LEFT ICA, patent vessel. 3. Severe stenosis LEFT vertebral artery origin, patent vertebral arteries. CTA HEAD: 1. No emergent large vessel  occlusion; reconstitution RIGHT ICA supraclinoid segment. 2. No flow-limiting stenosis. Moderate intracranial atherosclerosis. 3. Diminutive LEFT PCA, likely chronic. CT PERFUSION: 1. RIGHT frontal lobe penumbra, however at this corresponds to areas of infarct and is likely artifact. Critical Value/emergent results were called by telephone at the time of interpretation on 11/10/2018 at 1:10 am to Dr. Merrily Brittle , who verbally acknowledged these results. Emphysema (ICD10-J43.9). Electronically Signed   By: Awilda Metro M.D.   On: 11/10/2018 01:11   Ct Cerebral Perfusion W Contrast  Result Date: 11/10/2018 CLINICAL DATA:  RIGHT-sided weakness for 6 hours. EXAM: CT ANGIOGRAPHY HEAD AND NECK CT PERFUSION BRAIN TECHNIQUE: Multidetector CT imaging of the head and neck was performed using the standard protocol during bolus administration of intravenous contrast. Multiplanar  CT image reconstructions and MIPs were obtained to evaluate the vascular anatomy. Carotid stenosis measurements (when applicable) are obtained utilizing NASCET criteria, using the distal internal carotid diameter as the denominator. Multiphase CT imaging of the brain was performed following IV bolus contrast injection. Subsequent parametric perfusion maps were calculated using RAPID software. CONTRAST:  ISOVUE-370 IOPAMIDOL (ISOVUE-370) INJECTION 76% COMPARISON:  CT HEAD November 09, 2018 FINDINGS: CTA NECK FINDINGS: AORTIC ARCH: Normal appearance of the thoracic arch, moderate intimal thickening. The origins of the innominate, left Common carotid artery and subclavian artery are patent, mild stenosis innominate and LEFT carotid artery origins. RIGHT CAROTID SYSTEM: Common carotid artery is patent. RIGHT internal carotid artery occluded at the origin without reconstitution in the neck. LEFT CAROTID SYSTEM: Common carotid artery is patent. 11 mm segment critical stenosis LEFT ICA origin with string sign. Patent internal carotid  artery. VERTEBRAL ARTERIES:RIGHT vertebral artery is dominant. Severe stenosis LEFT vertebral artery origin. SKELETON: No acute osseous process though bone windows have not been submitted. Mild cervical spondylosis. OTHER NECK: Soft tissues of the neck are nonacute though, not tailored for evaluation. UPPER CHEST: Included lung apices are clear. Centrilobular emphysema. No superior mediastinal lymphadenopathy. CTA HEAD FINDINGS: ANTERIOR CIRCULATION: Reconstitution RIGHT ICA at supraclinoid segment likely via retrograde flow RIGHT ophthalmic artery. Patent LEFT internal carotid artery. Patent anterior middle cerebral arteries with moderate luminal irregularity. No large vessel occlusion, significant stenosis, contrast extravasation or aneurysm. POSTERIOR CIRCULATION: Patent vertebral arteries, vertebrobasilar junction and basilar artery, as well as main branch vessels. Patent posterior cerebral arteries. Diminutive LEFT PCA, patent posterior cerebral arteries with moderate luminal irregularity. No large vessel occlusion, significant stenosis, contrast extravasation or aneurysm. VENOUS SINUSES: Major dural venous sinuses are patent though not tailored for evaluation on this angiographic examination. ANATOMIC VARIANTS: Hypoplastic RIGHT A1 segment. Predominately azygos ACA. DELAYED PHASE: Not performed. MIP images reviewed. CT Brain Perfusion Findings: CBF (<30%) Volume: 0mL Perfusion (Tmax>6.0s) volume: 17mL Mismatch Volume: 17mL Infarction Location:RIGHT frontal lobe however degraded by motion. IMPRESSION: CTA NECK: 1. RIGHT internal carotid artery occluded at the origin, no reconstitution in the neck. 2. Critical stenosis LEFT ICA, patent vessel. 3. Severe stenosis LEFT vertebral artery origin, patent vertebral arteries. CTA HEAD: 1. No emergent large vessel occlusion; reconstitution RIGHT ICA supraclinoid segment. 2. No flow-limiting stenosis. Moderate intracranial atherosclerosis. 3. Diminutive LEFT PCA, likely  chronic. CT PERFUSION: 1. RIGHT frontal lobe penumbra, however at this corresponds to areas of infarct and is likely artifact. Critical Value/emergent results were called by telephone at the time of interpretation on 11/10/2018 at 1:10 am to Dr. Merrily Brittle , who verbally acknowledged these results. Emphysema (ICD10-J43.9). Electronically Signed   By: Awilda Metro M.D.   On: 11/10/2018 01:11   Ct Head Code Stroke Wo Contrast`  Result Date: 11/09/2018 CLINICAL DATA:  Code stroke. Altered mental status. Ataxia. Suspect stroke. History of LEFT carotid artery occlusion, hypertension, hypercholesterolemia and TIA. EXAM: CT HEAD WITHOUT CONTRAST TECHNIQUE: Contiguous axial images were obtained from the base of the skull through the vertex without intravenous contrast. COMPARISON:  None. FINDINGS: BRAIN: No intraparenchymal hemorrhage, mass effect nor midline shift. RIGHT inferior frontal encephalomalacia with mild ex vacuo dilatation RIGHT lateral ventricle small area RIGHT parietal encephalomalacia. Mild-to-moderate parenchymal brain volume loss. Old bilateral basal ganglia and thalami lacunar infarcts. Patchy to confluent supratentorial white matter hypodensities. No abnormal extra-axial fluid collections. VASCULAR: Moderate to severe calcific atherosclerosis carotid siphon. SKULL/SOFT TISSUES: No skull fracture. No significant soft tissue swelling. ORBITS/SINUSES: The included ocular globes  and orbital contents are normal.Trace RIGHT mastoid effusion. Minimal paranasal sinus mucosal thickening without air-fluid levels. OTHER: None. ASPECTS [facility] Stroke Program Early CT Score) - Ganglionic level infarction (caudate, lentiform nuclei, internal capsule, insula, M1-M3 cortex): 7 - Supraganglionic infarction (M4-M6 cortex): 3 Total score (0-10 with 10 being normal): 10 IMPRESSION: 1. No acute intracranial process. 2. ASPECTS is 10. 3. RIGHT frontal lobe encephalomalacia most compatible with TBI. 4. Old small  RIGHT parietal/MCA territory infarct. Multiple old basal ganglia and thalami lacunar infarcts. 5. Moderate chronic small vessel ischemic changes. 6. Moderate to severe atherosclerosis. 7. Critical Value/emergent results were called by telephone at the time of interpretation on 11/09/2018 at 11:53 pm to Dr. Lesly Rubenstein SUNG , who verbally acknowledged these results. Electronically Signed   By: Awilda Metro M.D.   On: 11/09/2018 23:53    Review of Systems  Constitutional: Negative for chills and fever.  HENT: Negative for sore throat and tinnitus.   Eyes: Negative for blurred vision and redness.  Respiratory: Negative for cough and shortness of breath.   Cardiovascular: Negative for chest pain, palpitations, orthopnea and PND.  Gastrointestinal: Negative for abdominal pain, diarrhea, nausea and vomiting.  Genitourinary: Negative for dysuria, frequency and urgency.  Musculoskeletal: Negative for joint pain and myalgias.  Skin: Negative for rash.       No lesions  Neurological: Positive for focal weakness. Negative for speech change and weakness.  Endo/Heme/Allergies: Does not bruise/bleed easily.       No temperature intolerance  Psychiatric/Behavioral: Negative for depression and suicidal ideas.    Blood pressure (!) 168/80, pulse 75, resp. rate 20, height 5\' 9"  (1.753 m), weight 77.1 kg, SpO2 96 %. Physical Exam  Vitals reviewed. Constitutional: He is oriented to person, place, and time. He appears well-developed and well-nourished. No distress.  HENT:  Head: Normocephalic and atraumatic.  Mouth/Throat: Oropharynx is clear and moist.  Eyes: Pupils are equal, round, and reactive to light. Conjunctivae and EOM are normal. No scleral icterus.  Neck: Normal range of motion. Neck supple. No JVD present. No tracheal deviation present. No thyromegaly present.  Cardiovascular: Normal rate, regular rhythm and normal heart sounds. Exam reveals no gallop and no friction rub.  No murmur  heard. Respiratory: Effort normal and breath sounds normal. No respiratory distress.  GI: Soft. Bowel sounds are normal. He exhibits no distension. There is no abdominal tenderness.  Genitourinary:    Genitourinary Comments: Deferred   Musculoskeletal: Normal range of motion.        General: No edema.  Lymphadenopathy:    He has no cervical adenopathy.  Neurological: He is alert and oriented to person, place, and time. No cranial nerve deficit.  Skin: Skin is warm and dry. No rash noted. No erythema.  Psychiatric: He has a normal mood and affect. His behavior is normal. Judgment and thought content normal.     Assessment/Plan This is a 57 year old male admitted for transient ischemic attack 1.  TIA: No acute stroke on CT.  Grip strength subtly worse on right compared to left.  Obtain MRI brain as well as carotid ultrasounds and echocardiogram.  Consult neurology.  Continue aspirin 2.  Cerebrovascular disease: Residual neurological deficits include some difficulty speaking as well as right-sided weakness.  Continue secondary prevention 3.  Hypertension: Permissive hypertension for now; resume lisinopril and hydrochlorothiazide in 24 hours. 4.  Hyperlipidemia: Continue statin therapy 5.  Tobacco abuse: NicoDerm patch while hospitalized.  I have counseled the patient for 5 minutes for smoking cessation 6.  DVT prophylaxis: Heparin 7.  GI prophylaxis: None The patient is a full code.  Time spent on admission orders and patient care approximately 45 minutes   Arnaldo Natal, MD 11/10/2018, 4:55 AM

## 2018-12-10 NOTE — Discharge Summary (Signed)
[facility] [place] [facility]   PATIENT NAME: Tyler Mathews    MR#:  [id number]  DATE OF BIRTH:  May 02, 1961  DATE OF ADMISSION:  11/09/2018 ADMITTING PHYSICIAN: Arnaldo Natal, MD  DATE OF DISCHARGE: 11/10/2018 10:06 AM  PRIMARY CARE PHYSICIAN: System, Pcp Not In   ADMISSION DIAGNOSIS:  Cerebrovascular accident (CVA), unspecified mechanism (HCC) [I63.9] Hypertension, unspecified type [I10]  DISCHARGE DIAGNOSIS:  Active Problems:   TIA (transient ischemic attack)   SECONDARY DIAGNOSIS:   Past Medical History:  Diagnosis Date  . Asthma   . Carotid artery obstruction, left   . Clot    behind right eye  . High cholesterol   . Hypertension   . Stroke (HCC)   . TIA (transient ischemic attack)      ADMITTING HISTORY  Chief Complaint: Weakness HPI: The patient with past medical history of multiple strokes presents to the emergency department complaining of right arm weakness.  The patient has difficulty with speech as well as chronic right-sided weakness as residual deficit from stroke history.  He reports that when he awoke this morning his right arm was weaker than the left and progressively worsened throughout the day.  Now he states that his "right arm feels heavy".  He denies chest pain or shortness of breath.  Notably the patient presented to the emergency department from a bar.  He was given chewable aspirin in the emergency department prior to the hospitalist service being called for admission.  HOSPITAL COURSE:   She was admitted to medical floor for further work-up of acute CVA.  Patient insisted on going out and smoking.  This was noted out.  Patient decided to leave AGAINST MEDICAL ADVICE.  Seen by neurology.  MRI was pending along with echo and carotid Dopplers.  Requested that patient stay and complete work-up.  But patient insisted on leaving AMA and signed papers and left.  CONSULTS OBTAINED:  Treatment Team:  Thana Farr,  MD  DRUG ALLERGIES:   Allergies  Allergen Reactions  . Codeine Diarrhea    DISCHARGE MEDICATIONS:   Allergies as of 11/10/2018      Reactions   Codeine Diarrhea      Medication List    ASK your doctor about these medications   albuterol 108 (90 Base) MCG/ACT inhaler Commonly known as:  PROVENTIL HFA;VENTOLIN HFA Inhale 1 puff into the lungs every 4 (four) hours as needed for wheezing or shortness of breath.   aspirin EC 81 MG tablet TAKE ONE TABLET BY MOUTH EVERY DAY   atorvastatin 10 MG tablet Commonly known as:  LIPITOR TAKE ONE TABLET BY MOUTH EVERY DAY AT 6:00pm   hydrochlorothiazide 12.5 MG capsule Commonly known as:  MICROZIDE Take 1 capsule (12.5 mg total) by mouth daily.   lisinopril 20 MG tablet Commonly known as:  PRINIVIL,ZESTRIL Take 1 tablet (20 mg total) by mouth at bedtime.   Meclizine HCl 25 MG Chew Chew 1 tablet (25 mg total) by mouth daily.   omeprazole 20 MG capsule Commonly known as:  PRILOSEC Take 1 capsule (20 mg total) by mouth daily.   tiotropium 18 MCG inhalation capsule Commonly known as:  SPIRIVA Place 1 capsule (18 mcg total) into inhaler and inhale daily.       Today   VITAL SIGNS:  Blood pressure (!) 147/70, pulse 76, temperature 99.7 F (37.6 C), temperature source Oral, resp. rate (!) 36, height 5\' 9"  (1.753 m), weight 75.3 kg, SpO2 95 %.  I/O:  No intake or output data in the 24 hours ending 12/10/18 1638  PHYSICAL EXAMINATION:  Physical Exam  GENERAL:  58 y.o.-year-old patient lying in the bed with no acute distress.  LUNGS: Normal breath sounds bilaterally, no wheezing, rales,rhonchi or crepitation. No use of accessory muscles of respiration.  CARDIOVASCULAR: S1, S2 normal. No murmurs, rubs, or gallops.  ABDOMEN: Soft, non-tender, non-distended. Bowel sounds present. No organomegaly or mass.  NEUROLOGIC: Moves all 4 extremities. PSYCHIATRIC: The patient is alert and oriented x 3.  SKIN: No obvious rash, lesion,  or ulcer.   DATA REVIEW:   CBC No results for input(s): WBC, HGB, HCT, PLT in the last 168 hours.  Chemistries  No results for input(s): NA, K, CL, CO2, GLUCOSE, BUN, CREATININE, CALCIUM, MG, AST, ALT, ALKPHOS, BILITOT in the last 168 hours.  Invalid input(s): GFRCGP  Cardiac Enzymes No results for input(s): TROPONINI in the last 168 hours.  Microbiology Results  No results found for this or any previous visit.  RADIOLOGY:  No results found.  Follow up with PCP in 1 week.  Management plans discussed with the patient, family and they are in agreement.  CODE STATUS:  Code Status History    Date Active Date Inactive Code Status Order ID Comments User Context   11/10/2018 0815 11/10/2018 1311 Full Code [id number]  Arnaldo Nataliamond, Michael S, MD Inpatient      TOTAL TIME TAKING CARE OF THIS PATIENT ON DAY OF DISCHARGE: more than 30 minutes.   Orie FishermanSrikar R Shekira Drummer M.D on 12/10/2018 at 4:38 PM  Between 7am to 6pm - Pager - [phone]  After 6pm go to [email or web address] - password [email or web address]  [facility]  Office  [phone]  CC: Primary care physician; System, Pcp Not In  Note: This dictation was prepared with Dragon dictation along with smaller phrase technology. Any transcriptional errors that result from this process are unintentional.

## 2019-10-22 IMAGING — CT CT HEAD CODE STROKE
3 series · 15 of 47 positions shown, 18 images · non-contrast
Comparison: None.

CLINICAL DATA: Code stroke. Altered mental status. Ataxia. Suspect
stroke. History of LEFT carotid artery occlusion, hypertension,
hypercholesterolemia and TIA.

EXAM:
CT HEAD WITHOUT CONTRAST
TECHNIQUE: Contiguous axial images were obtained from the base of the skull
through the vertex without intravenous contrast.

[Series 2: head wo · axial · 0.43mm/px · z∈[-160,-35]mm · 9 of 30 slices shown, 12 images]
[im 3/30  brain]
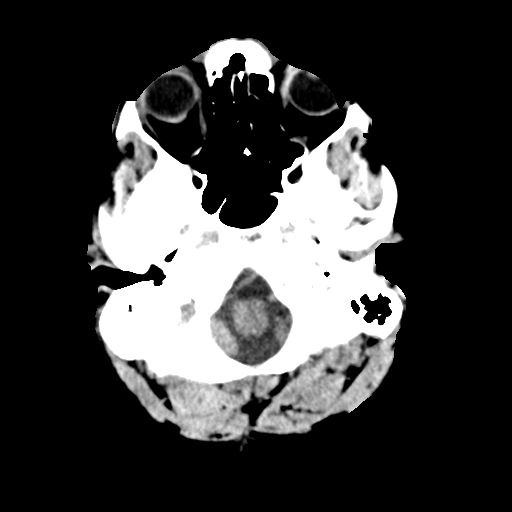
[im 3/30  bone]
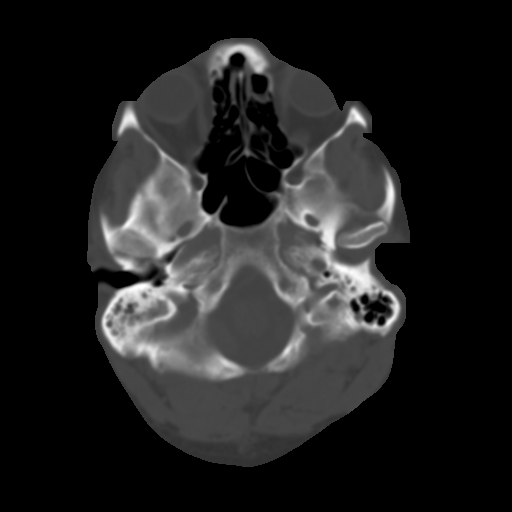
[im 6/30  brain]
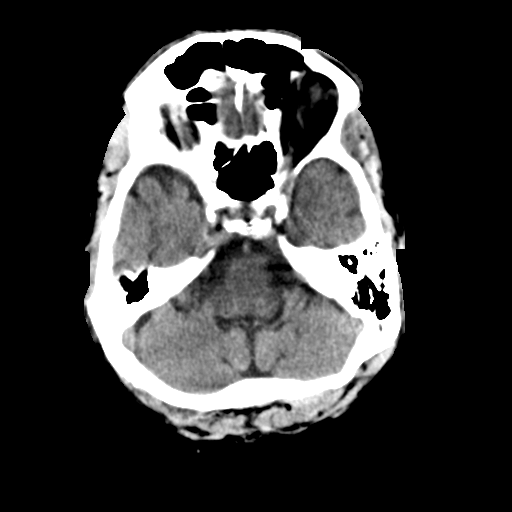
[im 9/30  brain]
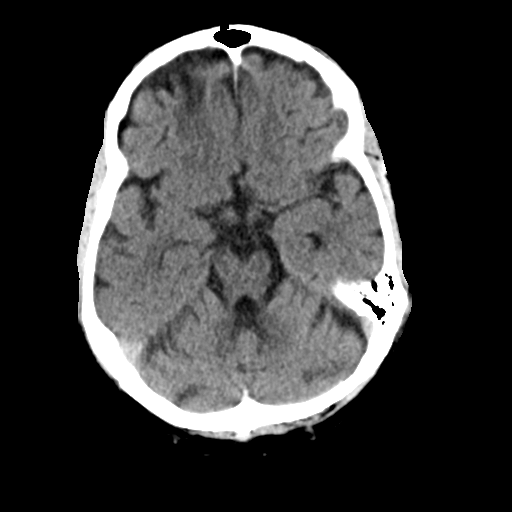
[im 12/30  brain]
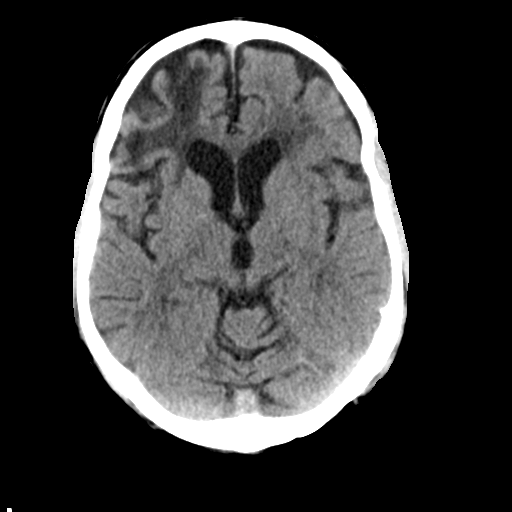
[im 16/30  brain]
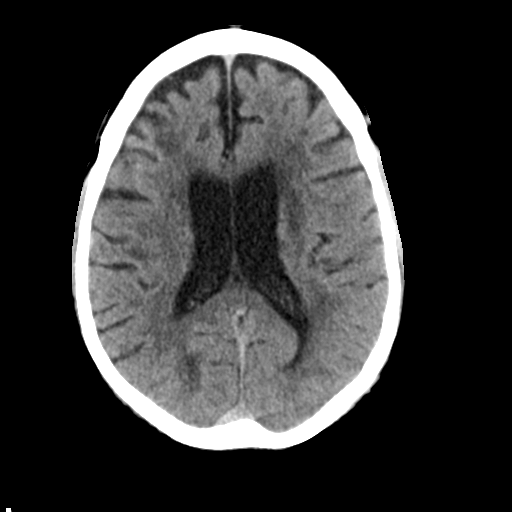
[im 16/30  bone]
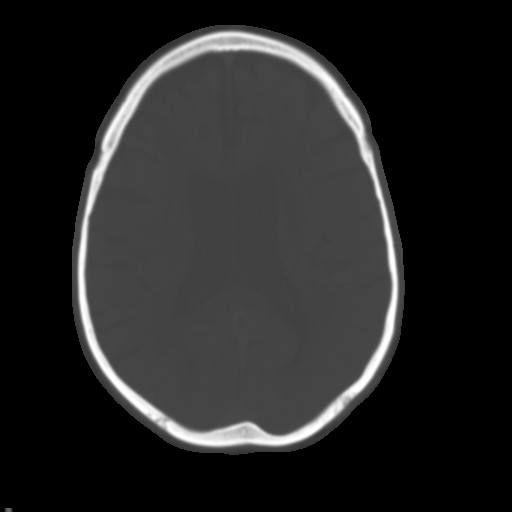
[im 19/30  brain]
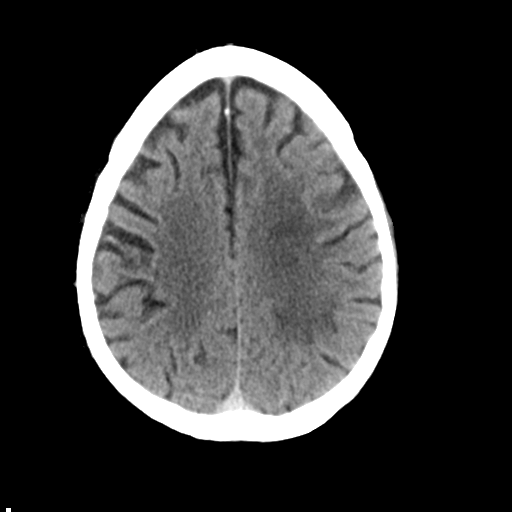
[im 22/30  brain]
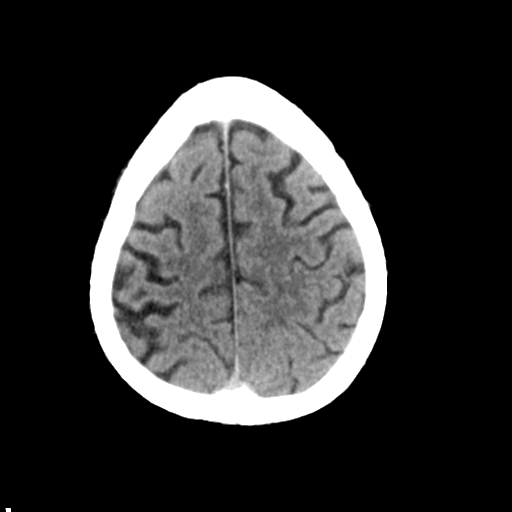
[im 25/30  brain]
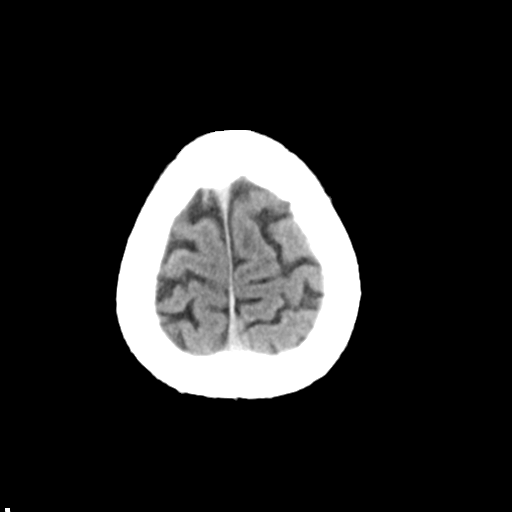
[im 28/30  brain]
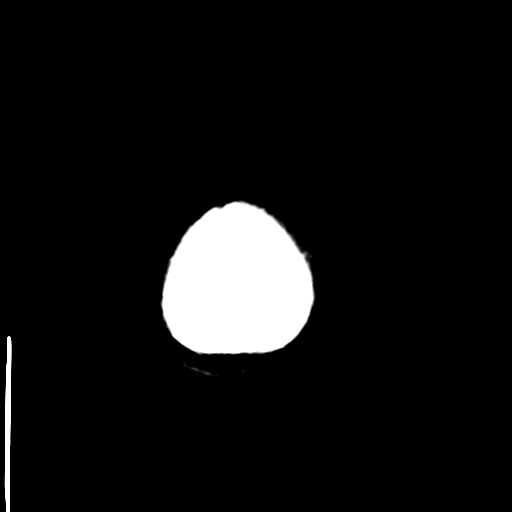
[im 28/30  bone]
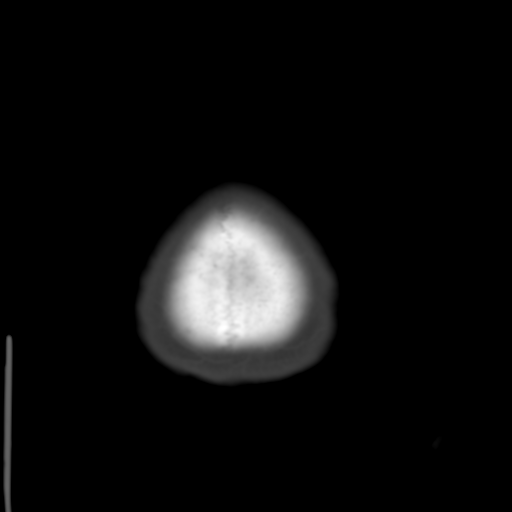

[Series 4: coronal soft tissue · coronal · 0.30mm/px · 3 of 65 slices shown]
[im 22/65  brain]
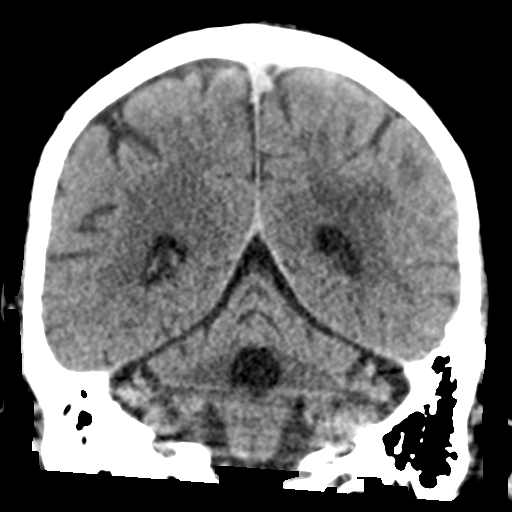
[im 29/65  brain]
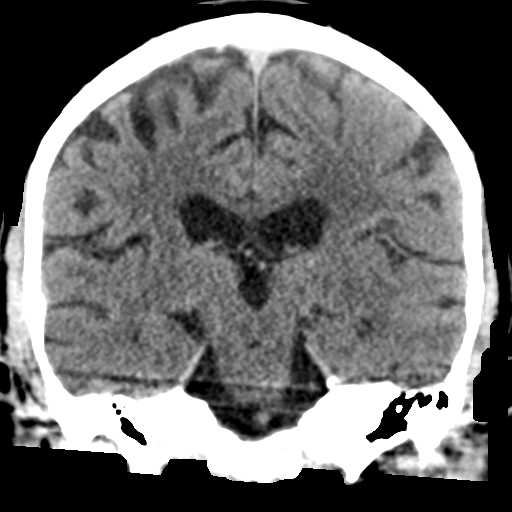
[im 36/65  brain]
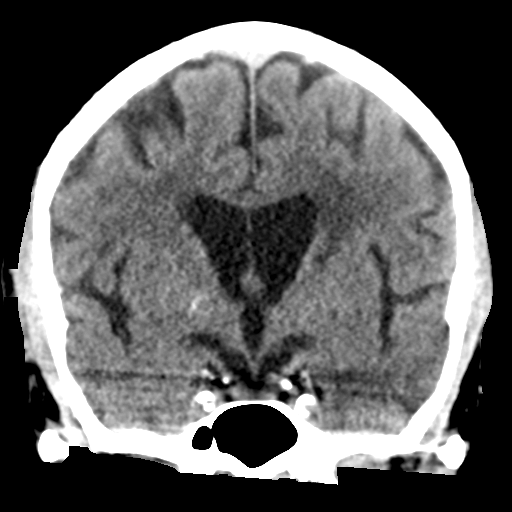

[Series 5: sagittal soft tissue · sagittal · 0.30mm/px · 3 of 52 slices shown]
[im 18/52  brain]
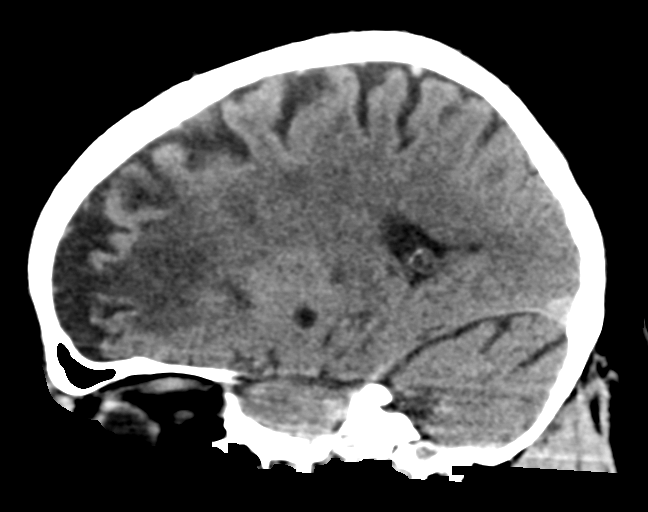
[im 26/52  brain]
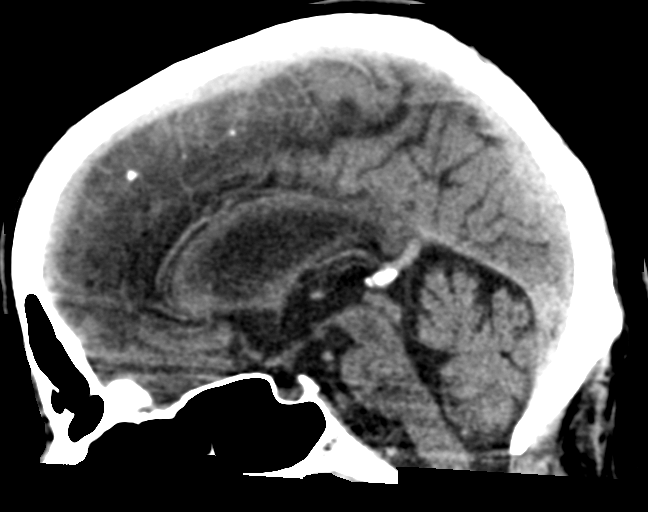
[im 35/52  brain]
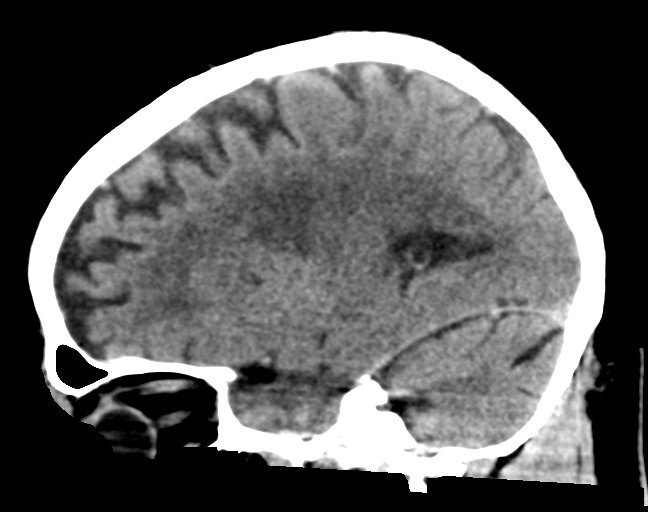

[15 of 47 positions shown; findings below may reference images not displayed]

FINDINGS: BRAIN: No intraparenchymal hemorrhage, mass effect nor midline
shift. RIGHT inferior frontal encephalomalacia with mild ex vacuo
dilatation RIGHT lateral ventricle small area RIGHT parietal
encephalomalacia. Mild-to-moderate parenchymal brain volume loss.
Old bilateral basal ganglia and thalami lacunar infarcts. Patchy to
confluent supratentorial white matter hypodensities. No abnormal
extra-axial fluid collections.

VASCULAR: Moderate to severe calcific atherosclerosis carotid
siphon.

SKULL/SOFT TISSUES: No skull fracture. No significant soft tissue
swelling.

ORBITS/SINUSES: The included ocular globes and orbital contents are
normal.Trace RIGHT mastoid effusion. Minimal paranasal sinus mucosal
thickening without air-fluid levels.

OTHER: None.

ASPECTS (Alberta Stroke Program Early CT Score)

- Ganglionic level infarction (caudate, lentiform nuclei, internal
capsule, insula, M1-M3 cortex): 7

- Supraganglionic infarction (M4-M6 cortex): 3

Total score (0-10 with 10 being normal): 10
IMPRESSION: 1. No acute intracranial process.
2. ASPECTS is 10.
3. RIGHT frontal lobe encephalomalacia most compatible with TBI.
4. Old small RIGHT parietal/MCA territory infarct. Multiple old
basal ganglia and thalami lacunar infarcts.
5. Moderate chronic small vessel ischemic changes.
6. Moderate to severe atherosclerosis.
7. Critical Value/emergent results were called by telephone at the
time of interpretation on 11/09/2018 at [DATE] to Dr. MARKLAND NUTSO ,
who verbally acknowledged these results.
